# Patient Record
Sex: Female | Born: 1960 | Race: White | Hispanic: No | Marital: Single | Smoking: Never smoker
Health system: Southern US, Community
[De-identification: ages and names within clinical notes are randomized; demographics above are authoritative.]

## PROBLEM LIST (undated history)

## (undated) DIAGNOSIS — N179 Acute kidney failure, unspecified: Secondary | ICD-10-CM

## (undated) DIAGNOSIS — L0390 Cellulitis, unspecified: Secondary | ICD-10-CM

## (undated) DIAGNOSIS — I872 Venous insufficiency (chronic) (peripheral): Secondary | ICD-10-CM

## (undated) DIAGNOSIS — E039 Hypothyroidism, unspecified: Secondary | ICD-10-CM

## (undated) DIAGNOSIS — S91301A Unspecified open wound, right foot, initial encounter: Secondary | ICD-10-CM

## (undated) DIAGNOSIS — I10 Essential (primary) hypertension: Secondary | ICD-10-CM

## (undated) DIAGNOSIS — E119 Type 2 diabetes mellitus without complications: Secondary | ICD-10-CM

## (undated) DIAGNOSIS — K219 Gastro-esophageal reflux disease without esophagitis: Secondary | ICD-10-CM

---

## 2015-08-20 ENCOUNTER — Institutional Professional Consult (permissible substitution): Payer: Self-pay | Admitting: Medical

## 2019-11-04 ENCOUNTER — Other Ambulatory Visit: Payer: Self-pay

## 2019-11-04 ENCOUNTER — Encounter (HOSPITAL_COMMUNITY): Payer: Self-pay

## 2019-11-04 ENCOUNTER — Emergency Department (HOSPITAL_COMMUNITY)
Admission: EM | Admit: 2019-11-04 | Discharge: 2019-11-04 | Disposition: A | Discharge status: Home / Self Care | Payer: Medicaid Other | Attending: Emergency Medicine | Admitting: Emergency Medicine

## 2019-11-04 DIAGNOSIS — Y998 Other external cause status: Secondary | ICD-10-CM | POA: Insufficient documentation

## 2019-11-04 DIAGNOSIS — S91012A Laceration without foreign body, left ankle, initial encounter: Secondary | ICD-10-CM | POA: Insufficient documentation

## 2019-11-04 DIAGNOSIS — W228XXA Striking against or struck by other objects, initial encounter: Secondary | ICD-10-CM | POA: Insufficient documentation

## 2019-11-04 DIAGNOSIS — Y9289 Other specified places as the place of occurrence of the external cause: Secondary | ICD-10-CM | POA: Insufficient documentation

## 2019-11-04 DIAGNOSIS — Y9301 Activity, walking, marching and hiking: Secondary | ICD-10-CM | POA: Insufficient documentation

## 2019-11-04 MED ORDER — LIDOCAINE HCL (PF) 1 % IJ SOLN
30.0000 mL | Freq: Once | INTRAMUSCULAR | Status: AC
  Administered 2019-11-04: 30 mL
  Filled 2019-11-04: qty 30

## 2019-11-04 NOTE — ED Notes (Signed)
[employer] notified of need for transport of pt back to residence.  

## 2019-11-04 NOTE — Discharge Instructions (Addendum)
Thank you for allowing me to care for you today in the Emergency Department.   Keep the wound clean and dry for the next 24 hours.  Then, you can gently clean the area with warm water and soap.  Gently, pat the area dry then apply a topical antibiotic such as bacitracin or Neosporin to the wound.  After applying a topical antibiotic, place a gauze dressing over the site before placing the ankle brace.  Try to not place as much weight on the left ankle until the staples are removed.  You need to wear the ankle brace constantly until you have your staples removed in approximately 10 days.  Because of the location of the wound, moving your ankle could cause the staples to come out.  You can have the staples removed at [facility], returning to the ER, going to urgent care, or following up with primary care.  You can take 650 mg of Tylenol once every 6 hours as needed for pain control.  You do not need to be on another antibiotic.  Just continue Ancef.  You need to return to the emergency department or have the wound reevaluated if you develop fevers, chills, if the area around the wound becomes red, hot to the touch, increasingly swollen, if you develop uncontrollable bleeding from the wound, or start to have thick, mucus-like drainage from the wound.

## 2019-11-04 NOTE — ED Provider Notes (Signed)
[facility]-EMERGENCY DEPT Provider Note   CSN: [id number] Arrival date & time: 11/04/19  0023     History Chief Complaint  Patient presents with  . Laceration    Chelsea Bolton is a 59 y.o. female with a history of morbid obesity, diabetes mellitus type 2, acute renal failure, GERD, HLD, and HTN who presents to the emergency department from [facility] with a chief complaint of left ankle laceration.  The patient reports that she was ambulating with her walker when she misjudged the corner and cut her left ankle on her metal bed frame.  She did not fall.  She has diabetic neuropathy in her bilateral lower extremities and denies pain or numbness.  No pain in her left ankle, knee, or foot.  She was able to ablate with her walker after the incident.   She is currently on Ancef after being discharged from [facility] for cellulitis of her right lower extremity.  No history of MRSA infections.  Tdap is up-to-date.  She reports that her blood sugars have been well controlled recently.  No recent fever, chills.   The history is provided by the patient. No language interpreter was used.       History reviewed. No pertinent past medical history.  There are no problems to display for this patient.   History reviewed. No pertinent surgical history.   OB History   No obstetric history on file.     No family history on file.  Social History   Tobacco Use  . Smoking status: Not on file  Substance Use Topics  . Alcohol use: Not on file  . Drug use: Not on file    Home Medications Prior to Admission medications   Not on File    Allergies    Patient has no allergy information on record.  Review of Systems   Review of Systems  Constitutional: Negative for activity change, chills and fever.  Respiratory: Negative for shortness of breath.   Cardiovascular: Negative for chest pain.  Gastrointestinal: Negative for abdominal pain, diarrhea, nausea  and vomiting.  Genitourinary: Negative for dysuria.  Musculoskeletal: Negative for arthralgias, back pain, gait problem, joint swelling and myalgias.  Skin: Positive for wound. Negative for color change and rash.  Allergic/Immunologic: Negative for immunocompromised state.  Neurological: Negative for weakness, numbness and headaches.  Psychiatric/Behavioral: Negative for confusion.    Physical Exam Updated Vital Signs BP 140/69   Resp 16   Ht 5\' 6"  (1.676 m)   Wt (!) 161 kg   SpO2 99%   BMI 57.30 kg/m   Physical Exam Vitals and nursing note reviewed.  Constitutional:      General: She is not in acute distress. HENT:     Head: Normocephalic.  Eyes:     Conjunctiva/sclera: Conjunctivae normal.  Cardiovascular:     Rate and Rhythm: Normal rate and regular rhythm.     Heart sounds: No murmur heard.  No friction rub. No gallop.   Pulmonary:     Effort: Pulmonary effort is normal. No respiratory distress.  Abdominal:     General: There is no distension.     Palpations: Abdomen is soft.  Musculoskeletal:     Cervical back: Neck supple.     Comments: Full active and passive range of motion of the left ankle.  No tenderness to palpation.  Independently moves all digits of the left foot.  5-5 strength against resistance with dorsiflexion plantarflexion.  Sensation is is at baseline  given the patient's history of diabetic neuropathy.  Bilateral lower extremities are edematous.  Right lower extremity is erythematous, which appears chronic.  There is mild redness noted to the left lower extremity, but no warmth.   Skin:    General: Skin is warm.     Findings: No rash.     Comments: There is a 6 cm stellate laceration with multiple flaps noted to the medial aspect of the left lower leg near the medial malleolus.  Subcutaneous tissue was exposed.  Wound is hemostatic.  Serous drainage is noted with palpation of the skin around the wound.  Wound appears clean without foreign bodies.    Neurological:     Mental Status: She is alert.  Psychiatric:        Behavior: Behavior normal.         ED Results / Procedures / Treatments   Labs (all labs ordered are listed, but only abnormal results are displayed) Labs Reviewed - No data to display  EKG None  Radiology No results found.  Procedures .Marland KitchenLaceration Repair  Date/Time: 11/04/2019 2:29 AM Performed by: Joanne Gavel, PA-C Authorized by: Joanne Gavel, PA-C   Consent:    Consent obtained:  Verbal   Consent given by:  Patient   Risks discussed:  Infection, pain, poor cosmetic result, poor wound healing, need for additional repair, vascular damage and tendon damage Anesthesia (see MAR for exact dosages):    Anesthesia method:  Local infiltration   Local anesthetic:  Lidocaine 2% w/o epi Laceration details:    Location:  Leg   Leg location:  L lower leg (Left medial ankle)   Length (cm):  6 Repair type:    Repair type:  Complex Pre-procedure details:    Preparation:  Patient was prepped and draped in usual sterile fashion Exploration:    Limited defect created (wound extended): no     Hemostasis achieved with:  Direct pressure   Wound exploration: wound explored through full range of motion and entire depth of wound probed and visualized     Wound extent: no fascia violation noted, no foreign bodies/material noted, no muscle damage noted, no nerve damage noted, no tendon damage noted, no underlying fracture noted and no vascular damage noted     Contaminated: no   Treatment:    Area cleansed with:  Shur-Clens   Amount of cleaning:  Standard   Irrigation method:  Pressure wash   Visualized foreign bodies/material removed: no     Debridement:  Minimal Skin repair:    Repair method:  Staples   Number of staples:  17 Approximation:    Approximation:  Close Post-procedure details:    Dressing:  Antibiotic ointment, bulky dressing and splint for protection   Patient tolerance of procedure:   Tolerated well, no immediate complications   (including critical care time)  Medications Ordered in ED Medications  lidocaine (PF) (XYLOCAINE) 1 % injection 30 mL (30 mLs Infiltration Given by Other 11/04/19 0210)    ED Course  I have reviewed the triage vital signs and the nursing notes.  Pertinent labs & imaging results that were available during my care of the patient were reviewed by me and considered in my medical decision making (see chart for details).    MDM Rules/Calculators/A&P                          59 year old female with a history of morbid obesity, diabetes mellitus type 2,  acute renal failure, GERD, HLD, and HTN who presents the emergency department from [facility] with a complex stellate laceration with multiple flaps to the medial left ankle after hitting the area on a metal bed frame.  Tdap is up-to-date.  X-ray is not indicated at this time given the mechanism of injury.  Vital signs are reassuring.  Shared decision-making conversation with the patient on wound closure.  We discussed the pros and cons of sutures versus staples and patient preferred wound closure with staples.  The wound was copiously irrigated and closed with 17 staples with good approximation of the wound despite 2 separate flaps.  The patient was seen and independently evaluated by Dr. Preston Fleeting, attending physician.  She is currently on a 30-day course of Ancef from her most recent hospitalization.  No need for additional antibiotic prophylaxis.  She was placed in and Aircast ankle boot after discussing with Ortho tech.  Home wound care instructions given.  Staples need to be removed in 10 days.  She is hemodynamically stable and in no acute distress at time of discharge.  ER return precautions given.  Safe for discharge to [facility].  Final Clinical Impression(s) / ED Diagnoses Final diagnoses:  Laceration of ankle, complicated, left, initial encounter    Rx / DC Orders ED Discharge Orders    None        Barkley Boards, PA-C 11/04/19 0754    Dione Booze, MD 11/04/19 [phone]

## 2019-11-04 NOTE — ED Triage Notes (Signed)
Per EMS,  Pt is coming from [facility]. Pt slipped and fell while trying to get into bed, pt acquired apprx 1 in lac to the left ankle. Denies LOC, pain to head/neck/back. Pt has hx of cellulitis in right leg.

## 2019-11-17 ENCOUNTER — Inpatient Hospital Stay (HOSPITAL_COMMUNITY): Payer: Medicaid Other

## 2019-11-17 ENCOUNTER — Inpatient Hospital Stay (HOSPITAL_COMMUNITY)
Admission: EM | Admit: 2019-11-17 | Discharge: 2019-11-24 | DRG: 871 | Disposition: A | Discharge status: Home / Self Care | Payer: Medicaid Other | Source: Skilled Nursing Facility | Attending: Internal Medicine | Admitting: Internal Medicine

## 2019-11-17 ENCOUNTER — Emergency Department (HOSPITAL_COMMUNITY): Payer: Medicaid Other

## 2019-11-17 ENCOUNTER — Other Ambulatory Visit: Payer: Self-pay

## 2019-11-17 ENCOUNTER — Encounter (HOSPITAL_COMMUNITY): Payer: Self-pay | Admitting: Emergency Medicine

## 2019-11-17 DIAGNOSIS — K429 Umbilical hernia without obstruction or gangrene: Secondary | ICD-10-CM | POA: Diagnosis present

## 2019-11-17 DIAGNOSIS — E1151 Type 2 diabetes mellitus with diabetic peripheral angiopathy without gangrene: Secondary | ICD-10-CM | POA: Diagnosis present

## 2019-11-17 DIAGNOSIS — I872 Venous insufficiency (chronic) (peripheral): Secondary | ICD-10-CM | POA: Diagnosis present

## 2019-11-17 DIAGNOSIS — S91301A Unspecified open wound, right foot, initial encounter: Secondary | ICD-10-CM | POA: Diagnosis present

## 2019-11-17 DIAGNOSIS — R6520 Severe sepsis without septic shock: Secondary | ICD-10-CM

## 2019-11-17 DIAGNOSIS — L03116 Cellulitis of left lower limb: Secondary | ICD-10-CM | POA: Diagnosis present

## 2019-11-17 DIAGNOSIS — K1379 Other lesions of oral mucosa: Secondary | ICD-10-CM | POA: Diagnosis present

## 2019-11-17 DIAGNOSIS — N281 Cyst of kidney, acquired: Secondary | ICD-10-CM | POA: Diagnosis present

## 2019-11-17 DIAGNOSIS — I10 Essential (primary) hypertension: Secondary | ICD-10-CM | POA: Diagnosis present

## 2019-11-17 DIAGNOSIS — R451 Restlessness and agitation: Secondary | ICD-10-CM | POA: Diagnosis not present

## 2019-11-17 DIAGNOSIS — E11649 Type 2 diabetes mellitus with hypoglycemia without coma: Secondary | ICD-10-CM | POA: Diagnosis present

## 2019-11-17 DIAGNOSIS — G4733 Obstructive sleep apnea (adult) (pediatric): Secondary | ICD-10-CM | POA: Diagnosis present

## 2019-11-17 DIAGNOSIS — Z20822 Contact with and (suspected) exposure to covid-19: Secondary | ICD-10-CM | POA: Diagnosis present

## 2019-11-17 DIAGNOSIS — Z888 Allergy status to other drugs, medicaments and biological substances status: Secondary | ICD-10-CM | POA: Diagnosis not present

## 2019-11-17 DIAGNOSIS — Z794 Long term (current) use of insulin: Secondary | ICD-10-CM

## 2019-11-17 DIAGNOSIS — N179 Acute kidney failure, unspecified: Secondary | ICD-10-CM | POA: Diagnosis present

## 2019-11-17 DIAGNOSIS — E1142 Type 2 diabetes mellitus with diabetic polyneuropathy: Secondary | ICD-10-CM | POA: Diagnosis present

## 2019-11-17 DIAGNOSIS — F329 Major depressive disorder, single episode, unspecified: Secondary | ICD-10-CM | POA: Diagnosis present

## 2019-11-17 DIAGNOSIS — I890 Lymphedema, not elsewhere classified: Secondary | ICD-10-CM | POA: Diagnosis present

## 2019-11-17 DIAGNOSIS — Z87442 Personal history of urinary calculi: Secondary | ICD-10-CM

## 2019-11-17 DIAGNOSIS — K219 Gastro-esophageal reflux disease without esophagitis: Secondary | ICD-10-CM | POA: Diagnosis present

## 2019-11-17 DIAGNOSIS — E872 Acidosis: Secondary | ICD-10-CM | POA: Diagnosis present

## 2019-11-17 DIAGNOSIS — A419 Sepsis, unspecified organism: Secondary | ICD-10-CM | POA: Diagnosis present

## 2019-11-17 DIAGNOSIS — Z79899 Other long term (current) drug therapy: Secondary | ICD-10-CM | POA: Diagnosis not present

## 2019-11-17 DIAGNOSIS — E039 Hypothyroidism, unspecified: Secondary | ICD-10-CM | POA: Diagnosis present

## 2019-11-17 DIAGNOSIS — L03115 Cellulitis of right lower limb: Secondary | ICD-10-CM | POA: Diagnosis present

## 2019-11-17 DIAGNOSIS — S81809A Unspecified open wound, unspecified lower leg, initial encounter: Secondary | ICD-10-CM

## 2019-11-17 DIAGNOSIS — L97509 Non-pressure chronic ulcer of other part of unspecified foot with unspecified severity: Secondary | ICD-10-CM | POA: Diagnosis present

## 2019-11-17 DIAGNOSIS — E11621 Type 2 diabetes mellitus with foot ulcer: Secondary | ICD-10-CM | POA: Diagnosis present

## 2019-11-17 DIAGNOSIS — D509 Iron deficiency anemia, unspecified: Secondary | ICD-10-CM | POA: Diagnosis present

## 2019-11-17 DIAGNOSIS — N3000 Acute cystitis without hematuria: Secondary | ICD-10-CM | POA: Diagnosis present

## 2019-11-17 DIAGNOSIS — R6521 Severe sepsis with septic shock: Secondary | ICD-10-CM | POA: Diagnosis present

## 2019-11-17 DIAGNOSIS — B9689 Other specified bacterial agents as the cause of diseases classified elsewhere: Secondary | ICD-10-CM | POA: Diagnosis present

## 2019-11-17 DIAGNOSIS — F419 Anxiety disorder, unspecified: Secondary | ICD-10-CM | POA: Diagnosis present

## 2019-11-17 DIAGNOSIS — Z6841 Body Mass Index (BMI) 40.0 and over, adult: Secondary | ICD-10-CM

## 2019-11-17 DIAGNOSIS — E11610 Type 2 diabetes mellitus with diabetic neuropathic arthropathy: Secondary | ICD-10-CM | POA: Diagnosis present

## 2019-11-17 DIAGNOSIS — I959 Hypotension, unspecified: Secondary | ICD-10-CM

## 2019-11-17 DIAGNOSIS — A4152 Sepsis due to Pseudomonas: Secondary | ICD-10-CM | POA: Diagnosis present

## 2019-11-17 DIAGNOSIS — E119 Type 2 diabetes mellitus without complications: Secondary | ICD-10-CM | POA: Diagnosis not present

## 2019-11-17 DIAGNOSIS — Z79890 Hormone replacement therapy (postmenopausal): Secondary | ICD-10-CM

## 2019-11-17 DIAGNOSIS — L89616 Pressure-induced deep tissue damage of right heel: Secondary | ICD-10-CM | POA: Diagnosis present

## 2019-11-17 DIAGNOSIS — L0390 Cellulitis, unspecified: Secondary | ICD-10-CM

## 2019-11-17 DIAGNOSIS — R5381 Other malaise: Secondary | ICD-10-CM | POA: Diagnosis present

## 2019-11-17 HISTORY — DX: Venous insufficiency (chronic) (peripheral): I87.2

## 2019-11-17 HISTORY — DX: Type 2 diabetes mellitus without complications: E11.9

## 2019-11-17 HISTORY — DX: Hypothyroidism, unspecified: E03.9

## 2019-11-17 HISTORY — DX: Cellulitis, unspecified: L03.90

## 2019-11-17 HISTORY — DX: Unspecified open wound, right foot, initial encounter: S91.301A

## 2019-11-17 HISTORY — DX: Gastro-esophageal reflux disease without esophagitis: K21.9

## 2019-11-17 HISTORY — DX: Acute kidney failure, unspecified: N17.9

## 2019-11-17 HISTORY — DX: Essential (primary) hypertension: I10

## 2019-11-17 HISTORY — DX: Morbid (severe) obesity due to excess calories: E66.01

## 2019-11-17 LAB — CBC WITH DIFFERENTIAL/PLATELET
Abs Immature Granulocytes: 0.56 10*3/uL — ABNORMAL HIGH (ref 0.00–0.07)
Basophils Absolute: 0.1 10*3/uL (ref 0.0–0.1)
Basophils Relative: 0 %
Eosinophils Absolute: 0.2 10*3/uL (ref 0.0–0.5)
Eosinophils Relative: 1 %
HCT: 30.8 % — ABNORMAL LOW (ref 36.0–46.0)
Hemoglobin: 9.3 g/dL — ABNORMAL LOW (ref 12.0–15.0)
Immature Granulocytes: 2 %
Lymphocytes Relative: 6 %
Lymphs Abs: 1.6 10*3/uL (ref 0.7–4.0)
MCH: 27.8 pg (ref 26.0–34.0)
MCHC: 30.2 g/dL (ref 30.0–36.0)
MCV: 91.9 fL (ref 80.0–100.0)
Monocytes Absolute: 1.1 10*3/uL — ABNORMAL HIGH (ref 0.1–1.0)
Monocytes Relative: 4 %
Neutro Abs: 22.7 10*3/uL — ABNORMAL HIGH (ref 1.7–7.7)
Neutrophils Relative %: 87 %
Platelets: 523 10*3/uL — ABNORMAL HIGH (ref 150–400)
RBC: 3.35 MIL/uL — ABNORMAL LOW (ref 3.87–5.11)
RDW: 17.2 % — ABNORMAL HIGH (ref 11.5–15.5)
WBC: 26.2 10*3/uL — ABNORMAL HIGH (ref 4.0–10.5)
nRBC: 0 % (ref 0.0–0.2)

## 2019-11-17 LAB — COMPREHENSIVE METABOLIC PANEL
ALT: <5 U/L (ref 0–44)
AST: 11 U/L — ABNORMAL LOW (ref 15–41)
Albumin: 2.1 g/dL — ABNORMAL LOW (ref 3.5–5.0)
Alkaline Phosphatase: 127 U/L — ABNORMAL HIGH (ref 38–126)
Anion gap: 13 (ref 5–15)
BUN: 46 mg/dL — ABNORMAL HIGH (ref 6–20)
CO2: 16 mmol/L — ABNORMAL LOW (ref 22–32)
Calcium: 8.5 mg/dL — ABNORMAL LOW (ref 8.9–10.3)
Chloride: 109 mmol/L (ref 98–111)
Creatinine, Ser: 2.43 mg/dL — ABNORMAL HIGH (ref 0.44–1.00)
GFR calc Af Amer: 24 mL/min — ABNORMAL LOW (ref >60)
GFR calc non Af Amer: 21 mL/min — ABNORMAL LOW (ref >60)
Glucose, Bld: 151 mg/dL — ABNORMAL HIGH (ref 70–99)
Potassium: 4.1 mmol/L (ref 3.5–5.1)
Sodium: 138 mmol/L (ref 135–145)
Total Bilirubin: 0.5 mg/dL (ref 0.3–1.2)
Total Protein: 6.7 g/dL (ref 6.5–8.1)

## 2019-11-17 LAB — BLOOD GAS, VENOUS
Acid-base deficit: 14.7 mmol/L — ABNORMAL HIGH (ref 0.0–2.0)
Bicarbonate: 10.9 mmol/L — ABNORMAL LOW (ref 20.0–28.0)
O2 Saturation: 95.3 %
Patient temperature: 98.6
pCO2, Ven: 25.4 mmHg — ABNORMAL LOW (ref 44.0–60.0)
pH, Ven: 7.255 (ref 7.250–7.430)
pO2, Ven: 85.5 mmHg — ABNORMAL HIGH (ref 32.0–45.0)

## 2019-11-17 LAB — I-STAT BETA HCG BLOOD, ED (MC, WL, AP ONLY): I-stat hCG, quantitative: <5 m[IU]/mL (ref <5)

## 2019-11-17 LAB — HEMOGLOBIN A1C
Hgb A1c MFr Bld: 6.8 % — ABNORMAL HIGH (ref 4.8–5.6)
Mean Plasma Glucose: 148.46 mg/dL

## 2019-11-17 LAB — LACTIC ACID, PLASMA: Lactic Acid, Venous: 1.4 mmol/L (ref 0.5–1.9)

## 2019-11-17 LAB — GLUCOSE, CAPILLARY: Glucose-Capillary: 146 mg/dL — ABNORMAL HIGH (ref 70–99)

## 2019-11-17 LAB — PROTIME-INR
INR: 1.3 — ABNORMAL HIGH (ref 0.8–1.2)
Prothrombin Time: 15.7 seconds — ABNORMAL HIGH (ref 11.4–15.2)

## 2019-11-17 LAB — TSH: TSH: 0.781 u[IU]/mL (ref 0.350–4.500)

## 2019-11-17 LAB — APTT: aPTT: 39 seconds — ABNORMAL HIGH (ref 24–36)

## 2019-11-17 LAB — CORTISOL: Cortisol, Plasma: 23.3 ug/dL

## 2019-11-17 MED ORDER — MAGNESIUM HYDROXIDE 400 MG/5ML PO SUSP: 30.0000 mL | Freq: Every day | ORAL | Status: DC | PRN

## 2019-11-17 MED ORDER — PHENOL 1.4 % MT LIQD
1.0000 | OROMUCOSAL | Status: DC | PRN
  Administered 2019-11-18: 1 via OROMUCOSAL
  Filled 2019-11-17: qty 177

## 2019-11-17 MED ORDER — PIPERACILLIN-TAZOBACTAM 3.375 G IVPB 30 MIN: 3.3750 g | Freq: Once | INTRAVENOUS | Status: DC

## 2019-11-17 MED ORDER — SODIUM CHLORIDE 0.9% FLUSH
3.0000 mL | Freq: Two times a day (BID) | INTRAVENOUS | Status: DC
  Administered 2019-11-17 – 2019-11-24 (×7): 3 mL via INTRAVENOUS

## 2019-11-17 MED ORDER — VANCOMYCIN HCL 2000 MG/400ML IV SOLN
2000.0000 mg | Freq: Once | INTRAVENOUS | Status: AC
  Administered 2019-11-17: 2000 mg via INTRAVENOUS
  Filled 2019-11-17: qty 400

## 2019-11-17 MED ORDER — LEVOTHYROXINE SODIUM 100 MCG PO TABS
100.0000 ug | ORAL_TABLET | Freq: Every day | ORAL | Status: DC
  Administered 2019-11-18 – 2019-11-24 (×7): 100 ug via ORAL
  Filled 2019-11-17 (×7): qty 1

## 2019-11-17 MED ORDER — SORBITOL 70 % SOLN: 30.0000 mL | Freq: Every day | Status: DC | PRN

## 2019-11-17 MED ORDER — SODIUM CHLORIDE 0.9 % IV SOLN: INTRAVENOUS | Status: DC

## 2019-11-17 MED ORDER — CHLORHEXIDINE GLUCONATE CLOTH 2 % EX PADS
6.0000 | MEDICATED_PAD | Freq: Every day | CUTANEOUS | Status: DC
  Administered 2019-11-18 – 2019-11-24 (×7): 6 via TOPICAL

## 2019-11-17 MED ORDER — METRONIDAZOLE IN NACL 5-0.79 MG/ML-% IV SOLN
500.0000 mg | Freq: Once | INTRAVENOUS | Status: AC
  Administered 2019-11-17: 500 mg via INTRAVENOUS
  Filled 2019-11-17: qty 100

## 2019-11-17 MED ORDER — VANCOMYCIN HCL IN DEXTROSE 1-5 GM/200ML-% IV SOLN: 1000.0000 mg | Freq: Once | INTRAVENOUS | Status: DC

## 2019-11-17 MED ORDER — BUPROPION HCL ER (XL) 300 MG PO TB24
300.0000 mg | ORAL_TABLET | Freq: Every day | ORAL | Status: DC
  Administered 2019-11-18 – 2019-11-24 (×7): 300 mg via ORAL
  Filled 2019-11-17 (×7): qty 1

## 2019-11-17 MED ORDER — SENNA 8.6 MG PO TABS
1.0000 | ORAL_TABLET | Freq: Two times a day (BID) | ORAL | Status: DC
  Administered 2019-11-22 – 2019-11-24 (×4): 8.6 mg via ORAL
  Filled 2019-11-17 (×12): qty 1

## 2019-11-17 MED ORDER — NOREPINEPHRINE 4 MG/250ML-% IV SOLN: 0.0000 ug/min | INTRAVENOUS | Status: DC

## 2019-11-17 MED ORDER — SODIUM CHLORIDE 0.9 % IV SOLN
2.0000 g | Freq: Once | INTRAVENOUS | Status: AC
  Administered 2019-11-17: 2 g via INTRAVENOUS
  Filled 2019-11-17: qty 2

## 2019-11-17 MED ORDER — PIPERACILLIN-TAZOBACTAM 3.375 G IVPB
3.3750 g | Freq: Three times a day (TID) | INTRAVENOUS | Status: DC
  Administered 2019-11-17 – 2019-11-20 (×8): 3.375 g via INTRAVENOUS
  Filled 2019-11-17 (×8): qty 50

## 2019-11-17 MED ORDER — INSULIN ASPART 100 UNIT/ML ~~LOC~~ SOLN
0.0000 [IU] | Freq: Three times a day (TID) | SUBCUTANEOUS | Status: DC
  Administered 2019-11-18 (×2): 2 [IU] via SUBCUTANEOUS
  Administered 2019-11-18 – 2019-11-19 (×3): 1 [IU] via SUBCUTANEOUS
  Administered 2019-11-19: 2 [IU] via SUBCUTANEOUS
  Administered 2019-11-20 – 2019-11-23 (×5): 1 [IU] via SUBCUTANEOUS

## 2019-11-17 MED ORDER — SODIUM CHLORIDE 0.9 % IV BOLUS (SEPSIS)
1000.0000 mL | Freq: Once | INTRAVENOUS | Status: AC
  Administered 2019-11-17: 1000 mL via INTRAVENOUS

## 2019-11-17 MED ORDER — ONDANSETRON HCL 4 MG/2ML IJ SOLN: 4.0000 mg | Freq: Four times a day (QID) | INTRAMUSCULAR | Status: DC | PRN

## 2019-11-17 MED ORDER — SODIUM CHLORIDE 0.9 % IV BOLUS
1000.0000 mL | Freq: Once | INTRAVENOUS | Status: AC
  Administered 2019-11-17: 1000 mL via INTRAVENOUS

## 2019-11-17 MED ORDER — OXYCODONE HCL 5 MG PO TABS
5.0000 mg | ORAL_TABLET | ORAL | Status: DC | PRN
  Administered 2019-11-19 – 2019-11-23 (×6): 5 mg via ORAL
  Filled 2019-11-17 (×6): qty 1

## 2019-11-17 MED ORDER — INSULIN ASPART 100 UNIT/ML ~~LOC~~ SOLN: 0.0000 [IU] | Freq: Every day | SUBCUTANEOUS | Status: DC

## 2019-11-17 MED ORDER — HEPARIN SODIUM (PORCINE) 5000 UNIT/ML IJ SOLN
5000.0000 [IU] | Freq: Three times a day (TID) | INTRAMUSCULAR | Status: DC
  Administered 2019-11-17 – 2019-11-24 (×20): 5000 [IU] via SUBCUTANEOUS
  Filled 2019-11-17 (×18): qty 1

## 2019-11-17 MED ORDER — SODIUM CHLORIDE 0.9% FLUSH: 10.0000 mL | INTRAVENOUS | Status: DC | PRN

## 2019-11-17 MED ORDER — VANCOMYCIN HCL 2000 MG/400ML IV SOLN: 2000.0000 mg | INTRAVENOUS | Status: DC

## 2019-11-17 MED ORDER — SODIUM CHLORIDE 0.9 % IV BOLUS (SEPSIS)
800.0000 mL | Freq: Once | INTRAVENOUS | Status: AC
  Administered 2019-11-17: 800 mL via INTRAVENOUS

## 2019-11-17 MED ORDER — GABAPENTIN 300 MG PO CAPS
600.0000 mg | ORAL_CAPSULE | Freq: Three times a day (TID) | ORAL | Status: DC
  Administered 2019-11-17 – 2019-11-24 (×20): 600 mg via ORAL
  Filled 2019-11-17 (×20): qty 2

## 2019-11-17 MED ORDER — ONDANSETRON HCL 4 MG PO TABS: 4.0000 mg | ORAL_TABLET | Freq: Four times a day (QID) | ORAL | Status: DC | PRN

## 2019-11-17 MED ORDER — HYDRALAZINE HCL 20 MG/ML IJ SOLN: 10.0000 mg | Freq: Four times a day (QID) | INTRAMUSCULAR | Status: DC | PRN

## 2019-11-17 MED ORDER — IBUPROFEN 200 MG PO TABS: 400.0000 mg | ORAL_TABLET | Freq: Four times a day (QID) | ORAL | Status: DC | PRN

## 2019-11-17 MED ORDER — PANTOPRAZOLE SODIUM 40 MG PO TBEC
40.0000 mg | DELAYED_RELEASE_TABLET | Freq: Every day | ORAL | Status: DC
  Administered 2019-11-18 – 2019-11-24 (×7): 40 mg via ORAL
  Filled 2019-11-17 (×7): qty 1

## 2019-11-17 NOTE — Plan of Care (Signed)

## 2019-11-17 NOTE — Progress Notes (Signed)
Pt screaming near end of exam to stop.  3 series send to pacs. Best obtainable images. Pt aborted scan due to pain and no longer wanting to lay on scanner table.

## 2019-11-17 NOTE — Progress Notes (Signed)
Pt receiving fluid boluses based on pt's ideal body wt of 59.3kg per MD order.  1800cc fluid already given per [facility].

## 2019-11-17 NOTE — Consult Note (Signed)
NAME:  Chelsea Bolton, MRN:  [id number], DOB:  [birth year], LOS: 0 ADMISSION DATE:  11/17/2019, CONSULTATION DATE: July 5 REFERRING MD: Dr. Avis Epley, CHIEF COMPLAINT: Pain in right foot  Brief History   59 year old female with a complicated past medical history presented to the [facility] emergency department complaining of right-sided foot pain, noted to be profoundly hypotensive on November 17, 2019.  She came from a SNF and was noted to be hypotensive and dizzy when working with PT. Pulmonary and critical care medicine consulted for evaluation of likely septic shock.  History of present illness   This is a 59 year old female with a complex past medical history who was recently discharged from [facility] in the setting of septic shock from right lower leg cellulitis and a chronic wound who presented to our emergency room today from a SNF after she was noted to be dizzy and hypotensive well working with physical therapy.  She tells me that after leaving [facility] she feels that her right leg wound was improving and there had been no change in drainage.  However, over the last 2 to 3 days she developed worsening pain in that foot with walking which she said felt like "multiple needles going into the foot".  She denied any sort of associated fever or chills.  She says she has not had recent change in urinary habits such as dysuria, polyuria or foul-smelling urine.  She denies any recent nausea or vomiting.  She was brought to our emergency room by EMS when she was noted to be hypotensive.  Here she has had blood pressures in the 70s and pulmonary and critical care medicine was consulted because she was still hypotensive after receiving IV fluids.  Upon my arrival to the emergency room it seems that she has not fully received her 30 cc/kg dosing of saline yet but her blood pressure has improved in the last 4 readings show systolic blood pressures greater than 90.  Throughout her  hospital stay in our emergency room her mentation has been normal and she is making a bit of urine.  Of note when she was hospitalized at [facility] there was initially clinical concern that she may have pyelonephritis.  Urology was seen because of her history of a renal stone, no hydronephrosis was noted on imaging.  She was also noted to have a right-sided kidney cyst.  She was treated with nearly a month of beta-lactam antibiotics for the cellulitis which was noted to be slow to resolve.  Infectious diseases followed her at [facility].  Past Medical History  Cellulitis of right lower extremity, umbilical hernia Type 2 diabetes History of kidney stone on the left side  Vitamin D deficiency Hypothyroidism Morbid obesity> class III BMI 60-69.9 Obstructive sleep apnea, recent hospitalization recommended nighttime BiPAP Gastroesophageal reflux disease Open wound right foot  Significant Hospital Events   July 5 admission  Consults:  Pulmonary and critical care medicine  Procedures:  Right upper extremity dual-lumen PICC line present on admission  Significant Diagnostic Tests:    Micro Data:  July 5 blood culture July 5 urine culture  Antimicrobials:  July 5 vancomycin July 5 cefepime July 5 Flagyl  Interim history/subjective:  As above  Objective   Blood pressure 135/90, pulse 78, temperature 97.8 F (36.6 C), temperature source Oral, resp. rate 14, height 5\' 6"  (1.676 m), weight (!) 156.5 kg, SpO2 100 %.       No intake  or output data in the 24 hours ending 11/17/19 1721 Filed Weights   11/17/19 1329  Weight: (!) 156.5 kg    Examination:  General:  Morbidly obese female, resting comfortably in bed HENT: NCAT OP clear, mucus membranes very dry PULM: CTA B, normal effort CV: RRR, no mgr GI: BS+, soft, nontender, umbilical hernia noted, easily reduces, midline scar Derm: Right upper extremity picc line dressing soiled, no  surrounding edema or drainage; lymphedema both legs, redness both legs R > L, bilateral dressings feet/ankles Neuro: awake, alert, no distress, MAEW  July 5 chest x-ray images independently reviewed showing clear lungs, no congestion, no infiltrate  Resolved Hospital Problem list     Assessment & Plan:  Severe sepsis with hypotension: Now improving with IV fluid administration.  Differential diagnosis for source of sepsis includes right lower extremity cellulitis versus possibly osteomyelitis given chronic wound (organism unspecified), less likely urinary tract source has no history to suggest this.  I think it is most likely her right leg that is causing the infection because of the increase in pain recently.  Her right upper extremity PICC line dressing is dirty and she is at risk for a central line infection. Her mentation has been fine despite the initial low blood pressures which now seem to have resolved. Does not need to be admitted by critical care medicine is currently does not need vasopressors Complete 30 cc/kg crystalloid Hold home antihypertensives Appears volume depleted, would continue IV fluid resuscitation with LR after completes 30cc bolus Agree with current antibiotic regimen Would consider MRI versus CT scan of right lower extremity considering open wound (of note no osteomeylitis was seen in June 2021 at [facility].) Change out PICC line or d/c altogether  AKI due to severe sepsis Volume resuscitation Monitor BMET and UOP Replace electrolytes as needed   Elevated anion gap metabolic acidosis: Expected anion gap is only 6 so suspect the elevated anion gap is due to uremia.  However, I wonder if there is not a component of nongap metabolic acidosis, plus minus adrenal insufficiency given the clinical picture. Check serum cortisol Check urine electrolytes Continue IV fluid resuscitation as you are doing  OSA Would use BIPAP at  night  Pulmonary & critical care medicine team is happy to see this patient again should the need arise. For now will be available PRN  Best practice:    Labs   CBC: Recent Labs  Lab 11/17/19 1430  WBC 26.2*  NEUTROABS 22.7*  HGB 9.3*  HCT 30.8*  MCV 91.9  PLT 523*    Basic Metabolic Panel: Recent Labs  Lab 11/17/19 1430  NA 138  K 4.1  CL 109  CO2 16*  GLUCOSE 151*  BUN 46*  CREATININE 2.43*  CALCIUM 8.5*   GFR: Estimated Creatinine Clearance: 38.6 mL/min (A) (by C-G formula based on SCr of 2.43 mg/dL (H)). Recent Labs  Lab 11/17/19 1430 11/17/19 1515  WBC 26.2*  --   LATICACIDVEN  --  1.4    Liver Function Tests: Recent Labs  Lab 11/17/19 1430  AST 11*  ALT <5  ALKPHOS 127*  BILITOT 0.5  PROT 6.7  ALBUMIN 2.1*   No results for input(s): LIPASE, AMYLASE in the last 168 hours. No results for input(s): AMMONIA in the last 168 hours.  ABG    Component Value Date/Time   HCO3 10.9 (L) 11/17/2019 1446   ACIDBASEDEF 14.7 (H) 11/17/2019 1446   O2SAT 95.3 11/17/2019 1446  Coagulation Profile: Recent Labs  Lab 11/17/19 1430  INR 1.3*    Cardiac Enzymes: No results for input(s): CKTOTAL, CKMB, CKMBINDEX, TROPONINI in the last 168 hours.  HbA1C: No results found for: HGBA1C  CBG: No results for input(s): GLUCAP in the last 168 hours.  Review of Systems:   Gen: per HPI HEENT: Denies blurred vision, double vision, hearing loss, tinnitus, sinus congestion, rhinorrhea, sore throat, neck stiffness, dysphagia PULM: Denies shortness of breath, cough, sputum production, hemoptysis, wheezing CV: Denies chest pain, edema, orthopnea, paroxysmal nocturnal dyspnea, palpitations GI: Denies abdominal pain, nausea, vomiting, diarrhea, hematochezia, melena, constipation, change in bowel habits GU: Denies dysuria, hematuria, polyuria, oliguria, urethral discharge Endocrine: Denies hot or cold intolerance, polyuria, polyphagia or appetite change Derm:  Denies rash, dry skin, scaling or peeling skin change Heme: Denies easy bruising, bleeding, bleeding gums Neuro: Denies headache, numbness, weakness, slurred speech, loss of memory or consciousness   Past Medical History  She,  has a past medical history of Acute renal failure (ARF) (HCC), Cellulitis, Diabetes mellitus without complication (HCC), GERD (gastroesophageal reflux disease), Hypertension, Hypothyroid, Morbid obesity (HCC), Open wound of right foot, and Venous insufficiency.   Surgical History   History reviewed. No pertinent surgical history.   Social History   reports that she has never smoked. She has never used smokeless tobacco.   Family History   Her family history is not on file.   Allergies Allergies  Allergen Reactions  . Ace Inhibitors     Other reaction(s): Cough (ALLERGY/intolerance)  . Amlodipine     Other reaction(s): Other (See Comments) Excessive fluid     Home Medications  Prior to Admission medications   Medication Sig Start Date End Date Taking? Authorizing Provider  buPROPion (WELLBUTRIN XL) 300 MG 24 hr tablet Take 300 mg by mouth daily.   Yes [provider]  carvedilol (COREG) 12.5 MG tablet Take 12.5 mg by mouth 2 (two) times daily with a meal.   Yes [provider]  Cholecalciferol 1.25 MG (50000 UT) capsule Take 50,000 Units by mouth daily.   Yes [provider]  gabapentin (NEURONTIN) 300 MG capsule Take 900 mg by mouth 3 (three) times daily.   Yes [provider]  insulin glargine (LANTUS SOLOSTAR) 100 UNIT/ML Solostar Pen Inject 25 Units into the skin daily. Prime with 2units before injection   Yes [provider]  insulin lispro (HUMALOG) 100 UNIT/ML injection Inject 2-10 Units into the skin See admin instructions. Tid wc and QHS  200-250=2 units 251-300= 4 units 301-350= 6 units 351-400= 8 units 400= 10units   Yes [provider]  levothyroxine (SYNTHROID) 100 MCG tablet Take 100  mcg by mouth daily before breakfast.   Yes [provider]  loperamide (IMODIUM A-D) 2 MG tablet Take 2-4 mg by mouth 2 (two) times daily as needed for diarrhea or loose stools. Take 4mg  initially then 2mg  after each stool   Yes [provider]  losartan (COZAAR) 100 MG tablet Take 100 mg by mouth daily.   Yes [provider]  metFORMIN (GLUCOPHAGE) 1000 MG tablet Take 1,000 mg by mouth 2 (two) times daily with a meal.   Yes [provider]  omeprazole (PRILOSEC) 40 MG capsule Take 40 mg by mouth daily.   Yes [provider]  ondansetron (ZOFRAN-ODT) 4 MG disintegrating tablet Take 4 mg by mouth every 8 (eight) hours as needed for nausea or vomiting.   Yes [provider]  solifenacin (VESICARE) 10 MG  tablet Take 10 mg by mouth daily.   Yes [provider]  traMADol (ULTRAM) 50 MG tablet Take 50 mg by mouth every 6 (six) hours as needed for moderate pain.   Yes [provider]  Heparin Lock Flush (HEPARIN FLUSH) 10 UNIT/ML SOLN injection 5 mLs by Intracatheter route once. Flush picc line after ABX and saline flush    [provider]  Sodium Chloride Flush (NORMAL SALINE FLUSH) 0.9 % SOLN Inject 10 mLs into the vein in the morning and at bedtime. Before and After ABX    [provider]     Critical care time: 30 minutes     Heber [place], MD [facility] Pager: [phone] Cell: [phone] If no response, call [phone]

## 2019-11-17 NOTE — Progress Notes (Signed)
Pharmacy Antibiotic Note  Chelsea Bolton is a 59 y.o. female recently treated for RLE cellulitis/wound at [facility], presented to the ED from nursing facility on 11/17/2019 with hypotension and SOB.  Pharmacy is consulted to start vancomycin and zosyn for sepsis and LE cellulitis.  - scr 2.43 (crcl~28N) - flagyl 500 mg x1 given at 1525 and cefepime 2gm IV x1 given at 1443  Plan: - vancomycin 2000 mg IV x1 given in the ED, then 2000 mg IV q48h for goal trough 15-20 -zosyn 3.375 gm IV q8h (infuse over 4 hrs) - daily scr  ______________________________________  Height: 5\' 6"  (167.6 cm) Weight: (!) 156.5 kg (345 lb) IBW/kg (Calculated) : 59.3  Temp (24hrs), Avg:97.8 F (36.6 C), Min:97.8 F (36.6 C), Max:97.8 F (36.6 C)  Recent Labs  Lab 11/17/19 1430 11/17/19 1515  WBC 26.2*  --   CREATININE 2.43*  --   LATICACIDVEN  --  1.4    Estimated Creatinine Clearance: 38.6 mL/min (A) (by C-G formula based on SCr of 2.43 mg/dL (H)).    Allergies  Allergen Reactions  . Ace Inhibitors     Other reaction(s): Cough (ALLERGY/intolerance)  . Amlodipine     Other reaction(s): Other (See Comments) Excessive fluid     Thank you for allowing pharmacy to be a part of this patient's care.  01/18/20 11/17/2019 6:52 PM

## 2019-11-17 NOTE — H&P (Signed)
[facility] History and Physical  Chelsea Bolton BLT:[id number] DOB: 1960/07/01 DOA: 11/17/2019 PCP: Georgann Housekeeper, MD  Admitted from: SNF Chief Complaint: Hypertension, worsening right lower extremity cellulitis  History of Present Illness: Chelsea Bolton is a 59 y.o. female with PMH significant for T2DM, severe morbid obesity with BMI 56, OSA on nightly BiPAP, GERD, chronic right lower extremity cellulitis, hypothyroidism. Patient was sent [facility] ED today from rehab at [facility] with an episode of dizziness, hypotension and worsening right lower extremity cellulitis.  'Care everywhere' reviewed. Hospitalized at [facility] from 6/1-6/16 for septic shock secondary to right lower extremity colitis.  She briefly required intubation, successfully extubated.  Because of profound acidosis, she also required 2 sessions of dialysis.  Respiratory status and renal function were back to baseline at the time of discharge. In the hospitalization, right leg cellulitis wound grew Proteus mirabilis, MSSA and Aerococcus urinae.  Imaging did not show evidence of osteomyelitis or myositis.  Per ID recommendation, she was discharged on IV Ancef for 2 more weeks at discharge which she completed tentatively towards the end of June. Patient states that post discharge, cellulitis was improving.  However, over the last 2 to 3 days she developed worsening pain in that foot with walking which she said felt like "multiple needles going into the foot".   No dysuria, polyuria or foul-smelling urine.  No nausea, vomiting.  Apparently, patient was doing physical therapy when she got dizzy.  Staff found her to be hypotensive and hypoxic.  By the time of EMS arrival, patient was noted to be alert, awake, oriented x4 with oxygen saturation 99% on room air.  Blood pressure 140/100 and normal sinus rhythm.   In the ED, patient was alert, awake, but noted to be hypotensive to 70s. Patient was started  on boluses of normal saline after which her blood pressure gradually improved to low 100s. Labs with serum bicarb low at 16, BUN/creatinine elevated to 46/2.43, WBC count elevated to 26, lactic acid 1.4, hemoglobin 9.3, platelets 523. Chest x-ray normal with right sided PICC line in place. Right foot x-ray did not show evidence of osteomyelitis. Pulmonary critical care consultation was obtained. Patient did not require ICU admission. Hospital service was consulted for inpatient admission and management.     Review of Systems:  All systems were reviewed and were negative unless otherwise mentioned in the HPI   Past medical history: Past Medical History:  Diagnosis Date  . Acute renal failure (ARF) (HCC)   . Cellulitis   . Diabetes mellitus without complication (HCC)   . GERD (gastroesophageal reflux disease)   . Hypertension   . Hypothyroid   . Morbid obesity (HCC)   . Open wound of right foot   . Venous insufficiency     Past surgical history: History reviewed. No pertinent surgical history.  Social History:  reports that she has never smoked. She has never used smokeless tobacco. No history on file for alcohol use and drug use.  Allergies:  Allergies  Allergen Reactions  . Ace Inhibitors     Other reaction(s): Cough (ALLERGY/intolerance)  . Amlodipine     Other reaction(s): Other (See Comments) Excessive fluid    Family history:  History reviewed. No pertinent family history.   Home Meds: Prior to Admission medications   Medication Sig Start Date End Date Taking? Authorizing Provider  buPROPion (WELLBUTRIN XL) 300 MG 24 hr tablet Take 300 mg by mouth daily.   Yes [provider]  carvedilol (  COREG) 12.5 MG tablet Take 12.5 mg by mouth 2 (two) times daily with a meal.   Yes [provider]  Cholecalciferol 1.25 MG (50000 UT) capsule Take 50,000 Units by mouth daily.   Yes [provider]  gabapentin (NEURONTIN) 300 MG capsule Take 900  mg by mouth 3 (three) times daily.   Yes [provider]  insulin glargine (LANTUS SOLOSTAR) 100 UNIT/ML Solostar Pen Inject 25 Units into the skin daily. Prime with 2units before injection   Yes [provider]  insulin lispro (HUMALOG) 100 UNIT/ML injection Inject 2-10 Units into the skin See admin instructions. Tid wc and QHS  200-250=2 units 251-300= 4 units 301-350= 6 units 351-400= 8 units 400= 10units   Yes [provider]  levothyroxine (SYNTHROID) 100 MCG tablet Take 100 mcg by mouth daily before breakfast.   Yes [provider]  loperamide (IMODIUM A-D) 2 MG tablet Take 2-4 mg by mouth 2 (two) times daily as needed for diarrhea or loose stools. Take 4mg  initially then 2mg  after each stool   Yes [provider]  losartan (COZAAR) 100 MG tablet Take 100 mg by mouth daily.   Yes [provider]  metFORMIN (GLUCOPHAGE) 1000 MG tablet Take 1,000 mg by mouth 2 (two) times daily with a meal.   Yes [provider]  omeprazole (PRILOSEC) 40 MG capsule Take 40 mg by mouth daily.   Yes [provider]  ondansetron (ZOFRAN-ODT) 4 MG disintegrating tablet Take 4 mg by mouth every 8 (eight) hours as needed for nausea or vomiting.   Yes [provider]  solifenacin (VESICARE) 10 MG tablet Take 10 mg by mouth daily.   Yes [provider]  traMADol (ULTRAM) 50 MG tablet Take 50 mg by mouth every 6 (six) hours as needed for moderate pain.   Yes [provider]  Heparin Lock Flush (HEPARIN FLUSH) 10 UNIT/ML SOLN injection 5 mLs by Intracatheter route once. Flush picc line after ABX and saline flush    [provider]  Sodium Chloride Flush (NORMAL SALINE FLUSH) 0.9 % SOLN Inject 10 mLs into the vein in the morning and at bedtime. Before and After ABX    [provider]    Physical Exam: Vitals:   11/17/19 1641 11/17/19 1643 11/17/19 1703 11/17/19 1731  BP: (!) 104/51 (!) 109/93 135/90  93/65  Pulse:  80 78 80  Resp:  (!) 27 14 (!) 21  Temp:      TempSrc:      SpO2:  100% 100% 100%  Weight:      Height:       Wt Readings from Last 3 Encounters:  11/17/19 (!) 156.5 kg  11/04/19 (!) 161 kg   Body mass index is 55.68 kg/m.  General exam: Appears calm and comfortable.  Not in distress at the time of my evaluation Skin: No rashes, lesions or ulcers. HEENT: Atraumatic, normocephalic, supple neck, no obvious bleeding Lungs: Clear to auscultation bilaterally CVS: Regular rate and rhythm, no murmur GI/Abd soft, distended from obesity, nontender, bowel sound present CNS: Alert, awake, oriented x3  Psychiatry: Depressed look Extremities: Chronic bilateral lower extremity lymphedema with stasis changes.  Right more than left, both have bandages on.  Media attached        Consult Orders  (From admission, onward)         Start     Ordered   11/17/19 1836  Consult to wound, ostomy, continence  Defer to floor nurse.  Once       Provider:  (Not yet assigned)  Question:  Reason for Consult?  Answer:  Right lower extremity cellulitis and ulcer   11/17/19 1835   11/17/19 1835  PT eval and treat  Routine        11/17/19 1835   11/17/19 1719  Consult to hospitalist  ALL PATIENTS BEING ADMITTED/HAVING PROCEDURES NEED COVID-19 SCREENING  Once       Comments: ALL PATIENTS BEING ADMITTED/HAVING PROCEDURES NEED COVID-19 SCREENING  Provider:  (Not yet assigned)  Question Answer Comment  Place call to: [facility]   Reason for Consult Admit      11/17/19 1718          Labs on Admission:   CBC: Recent Labs  Lab 11/17/19 1430  WBC 26.2*  NEUTROABS 22.7*  HGB 9.3*  HCT 30.8*  MCV 91.9  PLT 523*    Basic Metabolic Panel: Recent Labs  Lab 11/17/19 1430  NA 138  K 4.1  CL 109  CO2 16*  GLUCOSE 151*  BUN 46*  CREATININE 2.43*  CALCIUM 8.5*    Liver Function Tests: Recent Labs  Lab 11/17/19 1430  AST 11*  ALT <5  ALKPHOS 127*  BILITOT 0.5   PROT 6.7  ALBUMIN 2.1*   No results for input(s): LIPASE, AMYLASE in the last 168 hours. No results for input(s): AMMONIA in the last 168 hours.  Cardiac Enzymes: No results for input(s): CKTOTAL, CKMB, CKMBINDEX, TROPONINI in the last 168 hours.  BNP (last 3 results) No results for input(s): BNP in the last 8760 hours.  ProBNP (last 3 results) No results for input(s): PROBNP in the last 8760 hours.  CBG: No results for input(s): GLUCAP in the last 168 hours.  Lipase  No results found for: LIPASE   Urinalysis No results found for: COLORURINE, APPEARANCEUR, LABSPEC, PHURINE, GLUCOSEU, HGBUR, BILIRUBINUR, KETONESUR, PROTEINUR, UROBILINOGEN, NITRITE, LEUKOCYTESUR   Drugs of Abuse  No results found for: LABOPIA, COCAINSCRNUR, LABBENZ, AMPHETMU, THCU, LABBARB    Radiological Exams on Admission: DG Ankle Complete Left  Result Date: 11/17/2019 CLINICAL DATA:  New soft tissue wound.  Concern for osteomyelitis. EXAM: LEFT ANKLE COMPLETE - 3+ VIEW COMPARISON:  None. FINDINGS: There is no evidence of fracture, dislocation, or joint effusion. A plantar calcaneal enthesophyte is noted. Degenerative changes are seen involving the talonavicular joint and in the dorsal midfoot. There is soft tissue swelling around the ankle. No focal osseous demineralization is identified to suggest osteomyelitis. IMPRESSION: 1. No radiographic evidence of osteomyelitis. Electronically Signed   By: Romona Curls M.D.   On: 11/17/2019 15:54   DG Chest Port 1 View  Result Date: 11/17/2019 CLINICAL DATA:  Sepsis EXAM: PORTABLE CHEST 1 VIEW COMPARISON:  10/15/2019 FINDINGS: RIGHT-sided PICC line terminates in the mid to distal superior vena cava. Cardiomediastinal contours and hilar structures are normal. Trachea is midline. Lungs are clear.  No sign of pleural effusion. Visualized skeletal structures on limited assessment without acute process. IMPRESSION: No acute cardiopulmonary disease. RIGHT-sided PICC line in  place as described. Electronically Signed   By: Donzetta Kohut M.D.   On: 11/17/2019 15:05    ------------------------------------------------------------------------------------------------ Assessment/Plan: Active Problems:   Sepsis (HCC)  Severe sepsis with hypotension secondary to right lower extremity cellulitis -Patient complains of worsening pain in the right foot. -X-ray did not show any evidence of osteomyelitis.   -We will obtain an MRI of right foot for confirmation. -Wound care consulted.  May need surgical evaluation by podiatrist/orthopedics  after the MRI scan. -Broad-spectrum antibiotics started in the ED.  -I ordered for IV vancomycin and IV Zosyn through cellulitis order set. -IV bolus given in the ED.  Continue maintenance IV hydration. -Blood pressure was initially low at 70 with normal mental status.  With IV hydration her blood pressure is improved now to low 100s.  Continue to monitor. -Follow-up blood culture report. -Continue monitor WBC and temperature trend.  AKI secondary to sepsis Metabolic acidosis -Creatinine elevated to 2.43.  Baseline creatinine less than 1. -On chart review it is noted that during his recent stay at [place], [facility].  By discharge her renal function had improved back to normal. -Expect improvement in renal function with IV hydration.  Continue to monitor. -Urine electrolytes and random cortisol level sent.    Diabetes mellitus 2 -A1c 7.5 in June Peripheral neuropathy -Home meds include Lantus 25 units daily, Metformin 1000 mg twice daily, lispro SSI -Patient reports an episode of hypoglycemia yesterday morning, likely related to worsening kidney function. -We will keep Lantus and Metformin on hold for now.  Start only sliding scale insulin with Accu-Cheks. -We will resume Neurontin at a lower dose.  Chronic bilateral lower extremity lymphedema -Patient reports tenderness  history of bilateral lower extreme lymphedema. -I do not see any diuretics on her list of home medicines.  -Once blood pressure improves, diuretics may be considered.  Chronic anemia -Baseline hemoglobin 9-10 -Obtain anemia panel.  Morbid obesity - Body mass index is 55.68 kg/m. Patient has been advised to make an attempt to improve diet and exercise patterns to aid in weight loss.  OSA -Nightly BiPAP.  Left renal cystic mass  -[facility]  -Patient needs to follow-up with urology as outpatient  Anxiety/depression Wellbutrin 300 mg daily,  Hypertension -Home meds include Coreg 12.5 mg daily, losartan 100 mg daily. -I will keep both of them on hold for now.  Continue to monitor blood pressure.  Hydralazine as needed.  Hypothyroidism -Continue Synthroid 100 mcg daily,  GERD -Continue Prilosec 40 mg daily,  Mobility: Encourage ambulation.  PT eval ordered. Code Status:   Code Status: Not on file  DVT prophylaxis:  Heparin subcu Antimicrobials:  IV Zosyn and IV vancomycin Fluid: 100/h normal saline  Diet:  Cardiac/diabetic diet  Consultants: Critical care consult appreciated Family Communication:  None at bedside  [facility]: [facility]              Anticipated d/c is to: SNF              Anticipated d/c date is: 3 days  ------------------------------------------------------------------------------------- Severity of Illness: The appropriate patient status for this patient is INPATIENT. Inpatient status is judged to be reasonable and necessary in order to provide the required intensity of service to ensure the patient's safety. The patient's presenting symptoms, physical exam findings, and initial radiographic and laboratory data in the context of their chronic comorbidities is felt to place them at high risk for further clinical deterioration. Furthermore, it is not anticipated that the patient will be medically stable for  discharge from the hospital within 2 midnights of admission. The following factors support the patient status of inpatient.   " The patient's presenting symptoms include right lower extremity wound, hypertension. " The worrisome physical exam findings include right extremity cellulitis, sepsis " The initial radiographic and laboratory data are worrisome because of sepsis. " The chronic co-morbidities  include chronic lower extremity lymphedema, diabetes.   * I certify that at the point of admission it is my clinical judgment that the patient will require inpatient hospital care spanning beyond 2 midnights from the point of admission due to high intensity of service, high risk for further deterioration and high frequency of surveillance required.*  -------------------------------------------------------------------------------------   Mack Hook, MD [facility] Pager: [phone] (Secure Chat preferred). 11/17/2019

## 2019-11-17 NOTE — Progress Notes (Signed)
A consult was received from an ED physician for cefepime and vancomycin per pharmacy dosing.  The patient's profile has been reviewed for ht/wt/allergies/indication/available labs.   A one time order has been placed for cefepime 2 g iv per MD and vancomycin 2000 mg iv once along with metronidazole 500 mg iv once per MD.  Further antibiotics/pharmacy consults should be ordered by admitting physician if indicated.                       Thank you, Valentina Gu 11/17/2019  2:11 PM

## 2019-11-17 NOTE — ED Triage Notes (Signed)
Pt BIB ems and coming from [facility].  Pt was doing physical therapy when pt got dizzy.  Staff found pt to be hypotensive and low O2 sats.  On EMS arrival pt was noted to be a/o x 4 with O2 sats at 99% RA. EMS's BP was 140/100 and NSR.

## 2019-11-17 NOTE — ED Provider Notes (Signed)
[facility]-EMERGENCY DEPT Provider Note   CSN: [id number] Arrival date & time: 11/17/19  1312     History Chief Complaint  Patient presents with  . Hypotension  . Foot Pain    Chelsea Bolton is a 59 y.o. female.  Chelsea Bolton is a 59 y.o. female with hx of DM, GERD, HTN, hypothyroid, morbid obesity, cellulitis, ARF, venous insufficiency, who presents via EMS from [facility] via EMS for hypotension and foot pain.  Patient was doing rehab at the facility when she started to complain of dizziness, vitals were checked and she was found to be hypertensive and facility also reported hypoxia.  EMS was called, when they arrived they reported normal BP of 140/100 and no noted hypoxia, but upon arrival to the department patient has systolic BP in the 70s.  Despite this she is alert and mentating well, able to answer questions and provide history.  She states that she no longer feels dizzy and feels in general back to normal.  She states that she was admitted at [facility] at the beginning of June for 15 days, and was treated for cellulitis of her right lower extremity, which she reports she has been dealing with for several years now.  Chart review shows that the patient was admitted with septic shock, required intubation and pressors and was found to have a cellulitis as well as a kidney infection, she had acute renal failure and did require 2 rounds of dialysis, but upon discharge renal function had returned to normal.  Patient states that she completed 2 additional weeks of IV antibiotics in the facility and completed this last week.  States that on Saturday she started to notice some sharp stabbing pains in the right heel and foot which were new.  She states she noticed some superficial wounds on the foot but did not note any on her heel.  States that the redness on her leg has worsened some.  Patient also noted to have a dressing over her left ankle, she states that  she was seen in the emergency department on 6/22 after she tripped and cut her ankle, she had staples placed and these were removed about a week and half ago.  Patient reports that she thought the area is feeling well but has been keeping a dressing over it.  She denies chest pain, shortness of breath, cough, fevers or chills.  Denies abdominal pain, vomiting, diarrhea or any urinary symptoms or flank pain.  Patient adamantly states that she does not want to be admitted into the hospital again.        Past Medical History:  Diagnosis Date  . Acute renal failure (ARF) (HCC)   . Cellulitis   . Diabetes mellitus without complication (HCC)   . GERD (gastroesophageal reflux disease)   . Hypertension   . Hypothyroid   . Morbid obesity (HCC)   . Open wound of right foot   . Venous insufficiency     There are no problems to display for this patient.   History reviewed. No pertinent surgical history.   OB History   No obstetric history on file.     History reviewed. No pertinent family history.  Social History   Tobacco Use  . Smoking status: Never Smoker  . Smokeless tobacco: Never Used  Substance Use Topics  . Alcohol use: Not on file  . Drug use: Not on file    Home Medications Prior to Admission medications   Medication  Sig Start Date End Date Taking? Authorizing Provider  buPROPion (WELLBUTRIN XL) 300 MG 24 hr tablet Take 300 mg by mouth daily.   Yes [provider]  carvedilol (COREG) 12.5 MG tablet Take 12.5 mg by mouth 2 (two) times daily with a meal.   Yes [provider]  Cholecalciferol 1.25 MG (50000 UT) capsule Take 50,000 Units by mouth daily.   Yes [provider]  gabapentin (NEURONTIN) 300 MG capsule Take 900 mg by mouth 3 (three) times daily.   Yes [provider]  insulin glargine (LANTUS SOLOSTAR) 100 UNIT/ML Solostar Pen Inject 25 Units into the skin daily. Prime with 2units before injection   Yes [provider]  insulin lispro (HUMALOG) 100 UNIT/ML injection Inject 2-10 Units into the skin See admin instructions. Tid wc and QHS  200-250=2 units 251-300= 4 units 301-350= 6 units 351-400= 8 units 400= 10units   Yes [provider]  levothyroxine (SYNTHROID) 100 MCG tablet Take 100 mcg by mouth daily before breakfast.   Yes [provider]  loperamide (IMODIUM A-D) 2 MG tablet Take 2-4 mg by mouth 2 (two) times daily as needed for diarrhea or loose stools. Take 4mg  initially then 2mg  after each stool   Yes [provider]  losartan (COZAAR) 100 MG tablet Take 100 mg by mouth daily.   Yes [provider]  metFORMIN (GLUCOPHAGE) 1000 MG tablet Take 1,000 mg by mouth 2 (two) times daily with a meal.   Yes [provider]  omeprazole (PRILOSEC) 40 MG capsule Take 40 mg by mouth daily.   Yes [provider]  ondansetron (ZOFRAN-ODT) 4 MG disintegrating tablet Take 4 mg by mouth every 8 (eight) hours as needed for nausea or vomiting.   Yes [provider]  solifenacin (VESICARE) 10 MG tablet Take 10 mg by mouth daily.   Yes [provider]  traMADol (ULTRAM) 50 MG tablet Take 50 mg by mouth every 6 (six) hours as needed for moderate pain.   Yes [provider]  Heparin Lock Flush (HEPARIN FLUSH) 10 UNIT/ML SOLN injection 5 mLs by Intracatheter route once. Flush picc line after ABX and saline flush    [provider]  Sodium Chloride Flush (NORMAL SALINE FLUSH) 0.9 % SOLN Inject 10 mLs into the vein in the morning and at bedtime. Before and After ABX    [provider]    Allergies    Ace inhibitors and Amlodipine  Review of Systems   Review of Systems  Constitutional: Negative for chills and fever.  HENT: Negative.   Respiratory: Negative for cough and shortness of breath.   Cardiovascular: Positive for leg swelling. Negative for chest pain.  Gastrointestinal: Negative for abdominal pain, nausea and  vomiting.  Genitourinary: Negative for dysuria and frequency.  Musculoskeletal:       R foot pain  Skin: Positive for color change and wound. Negative for rash.  Neurological: Positive for dizziness. Negative for syncope, weakness and light-headedness.  All other systems reviewed and are negative.   Physical Exam Updated Vital Signs BP (!) 77/40   Pulse 81   Temp 97.8 F (36.6 C) (Oral)   Resp 16   Ht 5\' 6"  (1.676 m)   Wt (!) 156.5 kg   SpO2 100%   BMI 55.68 kg/m   Physical Exam Vitals and nursing note reviewed.  Constitutional:      General: She is not in acute distress.    Appearance: She is well-developed. She  is not diaphoretic.  HENT:     Head: Normocephalic and atraumatic.  Eyes:     General:        Right eye: No discharge.        Left eye: No discharge.     Pupils: Pupils are equal, round, and reactive to light.  Cardiovascular:     Rate and Rhythm: Normal rate and regular rhythm.     Pulses: Normal pulses.     Heart sounds: Normal heart sounds. No murmur heard.  No friction rub. No gallop.   Pulmonary:     Effort: Pulmonary effort is normal. No respiratory distress.     Breath sounds: Normal breath sounds. No wheezing or rales.     Comments: Respirations equal and unlabored, patient able to speak in full sentences, lungs clear to auscultation bilaterally Abdominal:     General: Bowel sounds are normal. There is no distension.     Palpations: Abdomen is soft. There is no mass.     Tenderness: There is no abdominal tenderness. There is no guarding.     Comments: Abdomen soft, nondistended, nontender to palpation in all quadrants without guarding or peritoneal signs  Musculoskeletal:        General: No deformity.     Cervical back: Neck supple.     Comments: Right lower leg erythematous and cellulitic, chronic appearing wounds to the shin as well as multiple superficial wounds to the foot, there are 2 wounds on the heel which patient was not aware of, no  obvious purulent drainage from the wound. Necrosis where previously stapled wound has not completely healed (see photo below)  Skin:    General: Skin is warm and dry.     Capillary Refill: Capillary refill takes less than 2 seconds.  Neurological:     Mental Status: She is alert.     Coordination: Coordination normal.     Comments: Speech is clear, able to follow commands CN III-XII intact Normal strength in upper and lower extremities bilaterally including dorsiflexion and plantar flexion, strong and equal grip strength Sensation normal to light and sharp touch Moves extremities without ataxia, coordination intact   Psychiatric:        Mood and Affect: Mood normal.        Behavior: Behavior normal.           ED Results / Procedures / Treatments   Labs (all labs ordered are listed, but only abnormal results are displayed) Labs Reviewed  COMPREHENSIVE METABOLIC PANEL - Abnormal; Notable for the following components:      Result Value   CO2 16 (*)    Glucose, Bld 151 (*)    BUN 46 (*)    Creatinine, Ser 2.43 (*)    Calcium 8.5 (*)    Albumin 2.1 (*)    AST 11 (*)    Alkaline Phosphatase 127 (*)    GFR calc non Af Amer 21 (*)    GFR calc Af Amer 24 (*)    All other components within normal limits  CBC WITH DIFFERENTIAL/PLATELET - Abnormal; Notable for the following components:   WBC 26.2 (*)    RBC 3.35 (*)    Hemoglobin 9.3 (*)    HCT 30.8 (*)    RDW 17.2 (*)    Platelets 523 (*)    Neutro Abs 22.7 (*)    Monocytes Absolute 1.1 (*)    Abs Immature Granulocytes 0.56 (*)    All other components within normal limits  APTT - Abnormal; Notable for the following components:   aPTT 39 (*)    All other components within normal limits  PROTIME-INR - Abnormal; Notable for the following components:   Prothrombin Time 15.7 (*)    INR 1.3 (*)    All other components within normal limits  BLOOD GAS, VENOUS - Abnormal; Notable for the following components:   pCO2, Ven  25.4 (*)    pO2, Ven 85.5 (*)    Bicarbonate 10.9 (*)    Acid-base deficit 14.7 (*)    All other components within normal limits  CULTURE, BLOOD (ROUTINE X 2)  CULTURE, BLOOD (ROUTINE X 2)  URINE CULTURE  TSH  LACTIC ACID, PLASMA  LACTIC ACID, PLASMA  URINALYSIS, ROUTINE W REFLEX MICROSCOPIC  I-STAT BETA HCG BLOOD, ED ([facility], [facility], [facility] ONLY)    EKG EKG Interpretation  Date/Time:  Monday November 17 2019 14:10:38 EDT Ventricular Rate:  79 PR Interval:    QRS Duration: 92 QT Interval:  367 QTC Calculation: 421 R Axis:   2 Text Interpretation: Sinus rhythm Probable left atrial enlargement Abnormal R-wave progression, early transition Confirmed by Tilden Fossaees, Elizabeth [phone]([id number]) on 11/17/2019 2:54:48 PM   Radiology DG Chest Port 1 View  Result Date: 11/17/2019 CLINICAL DATA:  Sepsis EXAM: PORTABLE CHEST 1 VIEW COMPARISON:  10/15/2019 FINDINGS: RIGHT-sided PICC line terminates in the mid to distal superior vena cava. Cardiomediastinal contours and hilar structures are normal. Trachea is midline. Lungs are clear.  No sign of pleural effusion. Visualized skeletal structures on limited assessment without acute process. IMPRESSION: No acute cardiopulmonary disease. RIGHT-sided PICC line in place as described. Electronically Signed   By: Donzetta KohutGeoffrey  Wile M.D.   On: 11/17/2019 15:05    Procedures .Critical Care Performed by: Dartha LodgeFord, Gabrianna Fassnacht N, PA-C Authorized by: Dartha LodgeFord, Jaylon Boylen N, PA-C   Critical care provider statement:    Critical care time (minutes):  45   Critical care was necessary to treat or prevent imminent or life-threatening deterioration of the following conditions:  Sepsis and shock   Critical care was time spent personally by me on the following activities:  Discussions with consultants, evaluation of patient's response to treatment, examination of patient, ordering and performing treatments and interventions, ordering and review of laboratory studies, ordering and review of radiographic studies,  pulse oximetry, re-evaluation of patient's condition, obtaining history from patient or surrogate and review of old charts   (including critical care time)  Medications Ordered in ED Medications  sodium chloride 0.9 % bolus 1,000 mL (1,000 mLs Intravenous New Bag/Given 11/17/19 1440)    And  sodium chloride 0.9 % bolus 800 mL (has no administration in time range)  metroNIDAZOLE (FLAGYL) IVPB 500 mg (500 mg Intravenous New Bag/Given 11/17/19 1525)  vancomycin (VANCOREADY) IVPB 2000 mg/400 mL (has no administration in time range)  ceFEPIme (MAXIPIME) 2 g in sodium chloride 0.9 % 100 mL IVPB (0 g Intravenous Stopped 11/17/19 1523)    ED Course  I have reviewed the triage vital signs and the nursing notes.  Pertinent labs & imaging results that were available during my care of the patient were reviewed by me and considered in my medical decision making (see chart for details).    MDM Rules/Calculators/A&P                           59 year old female arrives via EMS from SNF due to hypotension and foot pain.  On arrival patient with systolic blood pressure in  the 47s, despite this fortunately patient is alert and mentating well.  She has multiple wounds to the lower extremities with significant cellulitis, subcu.  This is likely chronic but given her hypertension high concern for potential infection.  Blood pressure checked manually multiple times consistent with systolic BP in the 70s.  Code sepsis initiated and patient started on broad-spectrum antibiotics given recent hospital admission with septic shock requiring pressors and then the patient, cultures grew out multiple species.  Was evaluated for osteomyelitis at this time without evidence.  She also has a new wound to the left ankle with surrounding erythema from recent laceration.  30 cc/kg fluid bolus given as well.  We will continue to monitor patient's blood pressure closely.  Labs significant for leukocytosis of 26.2, hemoglobin of 9.3,  acute kidney injury with creatinine of 2.43, was 0.8 at discharge from hospital.  CO2 16, no other significant electrolyte derangements.  Lactic acid is fortunately not elevated.  Chest x-ray is clear.  Urinalysis is pending.  X-ray of the left ankle with no evidence of osteomyelitis, recently had evaluated without evidence of osteo-.  High suspicion for cellulitis as source of infection.  Despite fluids and antibiotics patient maintains BP in the 70s without much improvement, has received > 1L IV fluids at this time Case discussed with Dr. Madilyn Hook who is seen and evaluated the patient as well.  Will start Levophed if pressure is not improving and consult critical care.  Case discussed with Dr. Kendrick Fries with critical care who has seen and evaluated patient, but now last for blood pressure readings have consistently been above 100, he will write consult note but recommends medicine admission  Case discussed with Dr. Pola Corn with [facility] hospitalist who will see and admit the patient. Final Clinical Impression(s) / ED Diagnoses Final diagnoses:  Hypotension, unspecified hypotension type  Septic shock (HCC)  Cellulitis of both lower extremities  Multiple opens wound of lower extremity, unspecified laterality, initial encounter    Rx / DC Orders ED Discharge Orders    None       Legrand Rams 11/19/19 0212    Tilden Fossa, MD 11/22/19 [phone]

## 2019-11-18 LAB — IRON AND TIBC
Iron: 16 ug/dL — ABNORMAL LOW (ref 28–170)
Saturation Ratios: 14 % (ref 10.4–31.8)
TIBC: 118 ug/dL — ABNORMAL LOW (ref 250–450)
UIBC: 102 ug/dL

## 2019-11-18 LAB — CBC WITH DIFFERENTIAL/PLATELET
Abs Immature Granulocytes: 0.7 10*3/uL — ABNORMAL HIGH (ref 0.00–0.07)
Basophils Absolute: 0.2 10*3/uL — ABNORMAL HIGH (ref 0.0–0.1)
Basophils Relative: 1 %
Eosinophils Absolute: 0.2 10*3/uL (ref 0.0–0.5)
Eosinophils Relative: 1 %
HCT: 28.7 % — ABNORMAL LOW (ref 36.0–46.0)
Hemoglobin: 8.4 g/dL — ABNORMAL LOW (ref 12.0–15.0)
Immature Granulocytes: 3 %
Lymphocytes Relative: 7 %
Lymphs Abs: 1.9 10*3/uL (ref 0.7–4.0)
MCH: 27.5 pg (ref 26.0–34.0)
MCHC: 29.3 g/dL — ABNORMAL LOW (ref 30.0–36.0)
MCV: 93.8 fL (ref 80.0–100.0)
Monocytes Absolute: 1.4 10*3/uL — ABNORMAL HIGH (ref 0.1–1.0)
Monocytes Relative: 5 %
Neutro Abs: 21.1 10*3/uL — ABNORMAL HIGH (ref 1.7–7.7)
Neutrophils Relative %: 83 %
Platelets: 437 10*3/uL — ABNORMAL HIGH (ref 150–400)
RBC: 3.06 MIL/uL — ABNORMAL LOW (ref 3.87–5.11)
RDW: 17.8 % — ABNORMAL HIGH (ref 11.5–15.5)
WBC: 25.4 10*3/uL — ABNORMAL HIGH (ref 4.0–10.5)
nRBC: 0 % (ref 0.0–0.2)

## 2019-11-18 LAB — BASIC METABOLIC PANEL
Anion gap: 10 (ref 5–15)
BUN: 48 mg/dL — ABNORMAL HIGH (ref 6–20)
CO2: 12 mmol/L — ABNORMAL LOW (ref 22–32)
Calcium: 7.6 mg/dL — ABNORMAL LOW (ref 8.9–10.3)
Chloride: 114 mmol/L — ABNORMAL HIGH (ref 98–111)
Creatinine, Ser: 2.25 mg/dL — ABNORMAL HIGH (ref 0.44–1.00)
GFR calc Af Amer: 27 mL/min — ABNORMAL LOW (ref >60)
GFR calc non Af Amer: 23 mL/min — ABNORMAL LOW (ref >60)
Glucose, Bld: 167 mg/dL — ABNORMAL HIGH (ref 70–99)
Potassium: 3.7 mmol/L (ref 3.5–5.1)
Sodium: 136 mmol/L (ref 135–145)

## 2019-11-18 LAB — URINALYSIS, ROUTINE W REFLEX MICROSCOPIC
Bacteria, UA: NONE SEEN
Bilirubin Urine: NEGATIVE
Glucose, UA: NEGATIVE mg/dL
Ketones, ur: NEGATIVE mg/dL
Nitrite: NEGATIVE
Protein, ur: 100 mg/dL — AB
Specific Gravity, Urine: 1.013 (ref 1.005–1.030)
WBC, UA: >50 WBC/hpf — ABNORMAL HIGH (ref 0–5)
pH: 6 (ref 5.0–8.0)

## 2019-11-18 LAB — PHOSPHORUS: Phosphorus: 5 mg/dL — ABNORMAL HIGH (ref 2.5–4.6)

## 2019-11-18 LAB — CREATININE, URINE, RANDOM: Creatinine, Urine: 78.04 mg/dL

## 2019-11-18 LAB — FERRITIN: Ferritin: 255 ng/mL (ref 11–307)

## 2019-11-18 LAB — GLUCOSE, CAPILLARY
Glucose-Capillary: 135 mg/dL — ABNORMAL HIGH (ref 70–99)
Glucose-Capillary: 152 mg/dL — ABNORMAL HIGH (ref 70–99)
Glucose-Capillary: 158 mg/dL — ABNORMAL HIGH (ref 70–99)
Glucose-Capillary: 132 mg/dL — ABNORMAL HIGH (ref 70–99)

## 2019-11-18 LAB — TRANSFERRIN: Transferrin: 84 mg/dL — ABNORMAL LOW (ref 192–382)

## 2019-11-18 LAB — CHLORIDE, URINE, RANDOM: Chloride Urine: 52 mmol/L

## 2019-11-18 LAB — NA AND K (SODIUM & POTASSIUM), RAND UR
Potassium Urine: 26 mmol/L
Sodium, Ur: 67 mmol/L

## 2019-11-18 LAB — FOLATE: Folate: 8.8 ng/mL (ref >5.9)

## 2019-11-18 LAB — MRSA PCR SCREENING: MRSA by PCR: NEGATIVE

## 2019-11-18 LAB — HIV ANTIBODY (ROUTINE TESTING W REFLEX): HIV Screen 4th Generation wRfx: NONREACTIVE

## 2019-11-18 LAB — MAGNESIUM: Magnesium: 1.4 mg/dL — ABNORMAL LOW (ref 1.7–2.4)

## 2019-11-18 LAB — VITAMIN B12: Vitamin B-12: 638 pg/mL (ref 180–914)

## 2019-11-18 MED ORDER — MAGNESIUM OXIDE 400 (241.3 MG) MG PO TABS
400.0000 mg | ORAL_TABLET | Freq: Two times a day (BID) | ORAL | Status: AC
  Administered 2019-11-18 (×2): 400 mg via ORAL
  Filled 2019-11-18 (×2): qty 1

## 2019-11-18 MED ORDER — K PHOS MONO-SOD PHOS DI & MONO 155-852-130 MG PO TABS
500.0000 mg | ORAL_TABLET | Freq: Two times a day (BID) | ORAL | Status: AC
  Administered 2019-11-18 (×2): 500 mg via ORAL
  Filled 2019-11-18 (×2): qty 2

## 2019-11-18 MED ORDER — CALCIUM CARBONATE-VITAMIN D 500-200 MG-UNIT PO TABS
1.0000 | ORAL_TABLET | Freq: Two times a day (BID) | ORAL | Status: AC
  Administered 2019-11-18 – 2019-11-19 (×4): 1 via ORAL
  Filled 2019-11-18 (×4): qty 1

## 2019-11-18 MED ORDER — MAGNESIUM SULFATE 2 GM/50ML IV SOLN
2.0000 g | Freq: Once | INTRAVENOUS | Status: AC
  Administered 2019-11-18: 2 g via INTRAVENOUS
  Filled 2019-11-18: qty 50

## 2019-11-18 MED ORDER — COLLAGENASE 250 UNIT/GM EX OINT
TOPICAL_OINTMENT | Freq: Every day | CUTANEOUS | Status: DC
  Filled 2019-11-18: qty 30

## 2019-11-18 MED ORDER — MAGIC MOUTHWASH W/LIDOCAINE
5.0000 mL | Freq: Three times a day (TID) | ORAL | Status: DC | PRN
  Administered 2019-11-18 – 2019-11-19 (×5): 5 mL via ORAL
  Filled 2019-11-18 (×6): qty 5

## 2019-11-18 MED ORDER — LIP MEDEX EX OINT
1.0000 "application " | TOPICAL_OINTMENT | CUTANEOUS | Status: DC | PRN
  Filled 2019-11-18: qty 7

## 2019-11-18 MED ORDER — ZINC OXIDE 40 % EX OINT
TOPICAL_OINTMENT | Freq: Three times a day (TID) | CUTANEOUS | Status: DC
  Filled 2019-11-18: qty 57

## 2019-11-18 NOTE — Progress Notes (Signed)
PROGRESS NOTE  Chelsea Bolton  DOB: [birth year]  PCP: Georgann Housekeeper, MD YIR:[id number]  DOA: 11/17/2019  LOS: 1 day   Chief Complaint  Patient presents with  . Hypotension  . Foot Pain   Brief narrative: Chelsea Bolton is a 59 y.o. female with PMH significant for T2DM, severe morbid obesity with BMI 56, OSA on nightly BiPAP, GERD, chronic right lower extremity cellulitis, hypothyroidism. Patient was sent [facility] ED today from rehab at [facility] with an episode of dizziness, hypotension and worsening right lower extremity cellulitis.  'Care everywhere' reviewed. Hospitalized at [facility] from 6/1-6/16 for septic shock secondary to right lower extremity colitis.  She briefly required intubation, successfully extubated.  Because of profound acidosis, she also required 2 sessions of dialysis.  Respiratory status and renal function were back to baseline at the time of discharge. In the hospitalization, right leg cellulitis wound grew Proteus mirabilis, MSSA and Aerococcus urinae.  Imaging did not show evidence of osteomyelitis or myositis.  Per ID recommendation, she was discharged on IV Ancef for 2 more weeks at discharge which she completed tentatively towards the end of June. Patient states that at the SNF, cellulitis was improving.  However, over the last 2 to 3 days she developed worsening pain in that foot with walking which she said felt like "multiple needles going into the foot".   No dysuria, polyuria or foul-smelling urine.  No nausea, vomiting.  On the day of admission, patient was doing physical therapy when she got dizzy.  Staff found her to be hypotensive and hypoxic.  By the time of EMS arrival, patient was noted to be alert, awake, oriented x4 with oxygen saturation 99% on room air.  Blood pressure 140/100 and normal sinus rhythm.   In the ED, patient was alert, awake, but noted to be hypotensive to 70s. Patient was started on boluses of normal saline  after which her blood pressure gradually improved to low 100s. Labs with serum bicarb low at 16, BUN/creatinine elevated to 46/2.43, WBC count elevated to 26, lactic acid 1.4, hemoglobin 9.3, platelets 523. Chest x-ray normal with right sided PICC line in place. Right foot x-ray did not show evidence of osteomyelitis. Pulmonary critical care consultation was obtained.  With improvement in blood pressure with fluid, patient did not require ICU admission. Hospitalist service was consulted for inpatient admission and management.  Subjective: Patient was seen and examined this morning.  Pleasant middle-aged Caucasian female. Looks older for age.  Lying down in bed.  Not in distress.  Feels better.  Pain controlled. Seen by wound care nurse today.   Chart reviewed. No fever, heart rate in 80s, blood pressure is mostly low in 90s WBC count still high at 25,000, hemoglobin low at 8.4. Creatinine improving, 2.25 today.  Magnesium low at 1.4, iron profile resulted with iron level at 16, ferritin level pending. Folate and vitamin B12 level normal.  Assessment/Plan: Severe sepsis with hypotension  Acute on chronic right lower extremity cellulitis -Patient presented with worsening pain in the right foot. -X-ray did not show any evidence of osteomyelitis. -MRI of right foot confirms soft tissue findings of cellulitis.  It did not show findings suggestive of osteomyelitis or abscess. -Wound care consult appreciated.  Local wound care recommended.  No need of surgical debridement at this time.  Patient needs to follow-up with orthopedics and wound care service as an outpatient. -Currently on IV vancomycin and IV Zosyn.  No fever but WBC count remains high.  Continue to monitor. -Follow-up blood culture report.  Hypotension at presentation History of hypertension -Patient was initially hypotensive down to 70s.  Blood pressure is now improved on maintenance hydration. Continue normal saline today at 100  mL/h.  Cortisol level sent in admission is normal. -Home meds include Coreg 12.5 mg daily, losartan 100 mg daily. -Currently both on hold because of hypotension.  AKI secondary to sepsis Metabolic acidosis -Creatinine elevated to 2.43.  Baseline creatinine less than 1. -With IV hydration, creatinine is improving, 2.5 today. -On chart review it is noted that during his recent stay at [facility], she had profound acidosis and required 2 sessions of dialysis.  By discharge her renal function had improved back to normal.  Diabetes mellitus 2 -A1c 7.5 in June Peripheral neuropathy -Home meds include Lantus 25 units daily, Metformin 1000 mg twice daily, lispro SSI -Currently patient's blood sugar is trending less than 150 only on sliding scale insulin. -Likely because of impaired kidney function. -Continue to monitor Accu-Cheks and reinitiate Lantus when appropriate. -Continue Neurontin at a lower dose.  Chronic bilateral lower extremity lymphedema -Patient reports tenderness history of bilateral lower extreme lymphedema last 10 years however she is not on any diuretics at home. -Once blood pressure improves, diuretics may be considered.  Chronic anemia -Baseline hemoglobin 9-10 -No active bleeding but hemoglobin down to 8.4 today.  Continue to monitor. -iron profile resulted with iron level at 16, ferritin level pending. -Folate and vitamin B12 level normal.  Morbid obesity - Body mass index is 55.68 kg/m. Patient has been advised to make an attempt to improve diet and exercise patterns to aid in weight loss.  OSA -Nightly BiPAP.  Left renal cystic mass  -identified while in [facility].  -Patient needs to follow-up with urology as outpatient  Anxiety/depression Wellbutrin 300 mg daily  Hypothyroidism -Continue Synthroid 100 mcg daily,  GERD -Continue Prilosec 40 mg daily  Pressure Injury 11/18/19 Heel Right Deep Tissue Pressure Injury - Purple or maroon localized area  of discolored intact skin or blood-filled blister due to damage of underlying soft tissue from pressure and/or shear. (Active)  11/18/19   Location: Heel  Location Orientation: Right  Staging: Deep Tissue Pressure Injury - Purple or maroon localized area of discolored intact skin or blood-filled blister due to damage of underlying soft tissue from pressure and/or shear.  Wound Description (Comments):   Present on Admission: Yes   Mobility: PT eval obtained.  SNF Code Status:   Code Status: Full Code  Nutritional status: Body mass index is 55.68 kg/m.     Diet Order            Diet heart healthy/carb modified Room service appropriate? Yes; Fluid consistency: Thin  Diet effective now                 DVT prophylaxis: heparin injection 5,000 Units Start: 11/17/19 2200   Antimicrobials:  IV Zosyn and IV vancomycin Fluid: Normal saline at 100 mL/h to continue  Consultants: Wound care Family Communication:  Not at bedside  Status is: Inpatient  Remains inpatient appropriate because:Persistent severe electrolyte disturbances, Ongoing diagnostic testing needed not appropriate for outpatient work up and IV treatments appropriate due to intensity of illness or inability to take PO   Dispo: The patient is from: SNF              Anticipated d/c is to: SNF              Anticipated d/c date  is: 3 days              Patient currently is not medically stable to d/c.       Infusions:  . sodium chloride 100 mL/hr at 11/18/19 0439  . piperacillin-tazobactam (ZOSYN)  IV 3.375 g (11/18/19 0440)  . [START ON 11/19/2019] vancomycin      Scheduled Meds: . buPROPion  300 mg Oral Daily  . calcium-vitamin D  1 tablet Oral BID  . Chlorhexidine Gluconate Cloth  6 each Topical Daily  . collagenase   Topical Daily  . gabapentin  600 mg Oral TID  . heparin  5,000 Units Subcutaneous Q8H  . insulin aspart  0-5 Units Subcutaneous QHS  . insulin aspart  0-9 Units Subcutaneous TID WC  .  levothyroxine  100 mcg Oral QAC breakfast  . liver oil-zinc oxide   Topical TID  . magnesium oxide  400 mg Oral BID  . pantoprazole  40 mg Oral Daily  . phosphorus  500 mg Oral BID  . senna  1 tablet Oral BID  . sodium chloride flush  3 mL Intravenous Q12H    Antimicrobials: Anti-infectives (From admission, onward)   Start     Dose/Rate Route Frequency Ordered Stop   11/19/19 1700  vancomycin (VANCOREADY) IVPB 2000 mg/400 mL     Discontinue     2,000 mg 200 mL/hr over 120 Minutes Intravenous Every 48 hours 11/17/19 1859     11/17/19 2100  piperacillin-tazobactam (ZOSYN) IVPB 3.375 g     Discontinue     3.375 g 12.5 mL/hr over 240 Minutes Intravenous Every 8 hours 11/17/19 1859     11/17/19 2015  piperacillin-tazobactam (ZOSYN) IVPB 3.375 g  Status:  Discontinued        3.375 g 100 mL/hr over 30 Minutes Intravenous  Once 11/17/19 1915 11/17/19 1920   11/17/19 2015  vancomycin (VANCOCIN) IVPB 1000 mg/200 mL premix  Status:  Discontinued        1,000 mg 200 mL/hr over 60 Minutes Intravenous  Once 11/17/19 1915 11/17/19 1920   11/17/19 1500  vancomycin (VANCOREADY) IVPB 2000 mg/400 mL        2,000 mg 200 mL/hr over 120 Minutes Intravenous  Once 11/17/19 1411 11/17/19 2137   11/17/19 1415  ceFEPIme (MAXIPIME) 2 g in sodium chloride 0.9 % 100 mL IVPB        2 g 200 mL/hr over 30 Minutes Intravenous  Once 11/17/19 1407 11/17/19 1523   11/17/19 1415  metroNIDAZOLE (FLAGYL) IVPB 500 mg        500 mg 100 mL/hr over 60 Minutes Intravenous  Once 11/17/19 1407 11/17/19 1628   11/17/19 1415  vancomycin (VANCOCIN) IVPB 1000 mg/200 mL premix  Status:  Discontinued        1,000 mg 200 mL/hr over 60 Minutes Intravenous  Once 11/17/19 1407 11/17/19 1409      PRN meds: hydrALAZINE, ibuprofen, magic mouthwash w/lidocaine, magnesium hydroxide, ondansetron **OR** ondansetron (ZOFRAN) IV, oxyCODONE, phenol, sodium chloride flush, sorbitol   Objective: Vitals:   11/18/19 0234 11/18/19 0622  BP:  (!) 97/59 100/69  Pulse: 82 81  Resp: 18 20  Temp: 97.9 F (36.6 C) (!) 97.5 F (36.4 C)  SpO2: 96% 99%    Intake/Output Summary (Last 24 hours) at 11/18/2019 1101 Last data filed at 11/18/2019 0800 Gross per 24 hour  Intake 300.29 ml  Output 200 ml  Net 100.29 ml   Filed Weights   11/17/19 1329  Weight: Marland Kitchen)  156.5 kg   Weight change:  Body mass index is 55.68 kg/m.   Physical Exam: General exam: Appears calm and comfortable.  Not in physical distress Skin: No rashes, lesions or ulcers. HEENT: Atraumatic, normocephalic, supple neck, no obvious bleeding Lungs: Clear to auscultation bilaterally CVS: Regular rate and rhythm, no murmur GI/Abd soft, nontender, distended from obesity. CNS: Alert, awake, oriented x3 Psychiatry: Depressed look Extremities: Chronic bilateral lower extremity lymphedema with chronic stasis changes, both legs have bandages on, changed this morning by wound care nurse..  Data Review: I have personally reviewed the laboratory data and studies available.  Recent Labs  Lab 11/17/19 1430 11/18/19 0437  WBC 26.2* 25.4*  NEUTROABS 22.7* 21.1*  HGB 9.3* 8.4*  HCT 30.8* 28.7*  MCV 91.9 93.8  PLT 523* 437*   Recent Labs  Lab 11/17/19 1430 11/18/19 0437  NA 138 136  K 4.1 3.7  CL 109 114*  CO2 16* 12*  GLUCOSE 151* 167*  BUN 46* 48*  CREATININE 2.43* 2.25*  CALCIUM 8.5* 7.6*  MG  --  1.4*  PHOS  --  5.0*    Signed, Lorin GlassBinaya Day Greb, MD [facility] Pager: [phone] (Secure Chat preferred). 11/18/2019

## 2019-11-18 NOTE — Evaluation (Signed)
Occupational Therapy Evaluation Patient Details Name: Chelsea Bolton MRN: [id number] DOB: 1960/09/19 Today's Date: 11/18/2019    History of Present Illness Chelsea Bolton is a 59 y.o. female with PMH significant for T2DM, severe morbid obesity with BMI 56, OSA on nightly BiPAP, GERD, chronic right lower extremity cellulitis, hypothyroidism.Patient was sent [facility] ED today from rehab at [facility] with an episode of dizziness, hypotension and worsening right lower extremity cellulitis.Hospitalized at [facility] from 6/1-6/16 for septic shock secondary to right lower extremity colitis.  She briefly required intubation, successfully extubated.  Because of profound acidosis, she also required 2 sessions of dialysis.  Respiratory status and renal function were back to baseline at the time of discharge   Clinical Impression   Patient with functional deficits listed below impacting safety and independence with self care. Patient having significant pain in R heel, requiring multiple attempts to stand from recliner chair with max A x2 and max cues for safety, body mechanics and use of AD (walker). Patient max A x2 stand pivot to bedside commode and back to recliner to eat breakfast. Will continue to follow.    Follow Up Recommendations  SNF    Equipment Recommendations  Other (comment);Wheelchair (measurements OT) (walker)       Precautions / Restrictions Precautions Precautions: Fall Precaution Comments: R heel sore, painful Restrictions Weight Bearing Restrictions: No Other Position/Activity Restrictions: unsure about weight on right heel      Mobility Bed Mobility Overal bed mobility: Needs Assistance Bed Mobility: Sit to Supine       Sit to supine: Max assist;+2 for physical assistance;+2 for safety/equipment   General bed mobility comments: OOB in recliner upon arrival  Transfers Overall transfer level: Needs assistance Equipment used: Rolling walker (2  wheeled);2 person hand held assist Transfers: Sit to/from [employer] Sit to Stand: Max assist;+2 physical assistance;+2 safety/equipment Stand pivot transfers: Max assist;+2 physical assistance;+2 safety/equipment       General transfer comment: pt require multiple attempts and max cues before standing from recliner to transfer to bedside commode. decreased safety awareness    Balance Overall balance assessment: Needs assistance Sitting-balance support: No upper extremity supported;Feet supported Sitting balance-Leahy Scale: Fair     Standing balance support: Bilateral upper extremity supported;During functional activity Standing balance-Leahy Scale: Poor Standing balance comment: decreased WB on right so balance is tenuous                           ADL either performed or assessed with clinical judgement   ADL Overall ADL's : Needs assistance/impaired Eating/Feeding: Independent;Sitting   Grooming: Set up;Sitting   Upper Body Bathing: Minimal assistance;Sitting   Lower Body Bathing: Moderate assistance;Sitting/lateral leans;Sit to/from stand   Upper Body Dressing : Minimal assistance;Sitting   Lower Body Dressing: Moderate assistance;Sitting/lateral leans;Sit to/from stand   Toilet Transfer: Maximal assistance;+2 for physical assistance;+2 for safety/equipment;Cueing for safety;Cueing for sequencing;Stand-pivot;BSC;RW Toilet Transfer Details (indicate cue type and reason): multimodal cues for body mechanics and to utilize walker to alleviate pressure through R LE/heel however patient stating "it won't work" also requiring hand held assistance Toileting- Architect and Hygiene: Set up;Sitting/lateral lean Toileting - Clothing Manipulation Details (indicate cue type and reason): peri care after voiding     Functional mobility during ADLs: Maximal assistance;+2 for physical assistance;+2 for safety/equipment;Rolling walker;Cueing for  safety;Cueing for sequencing General ADL Comments: patient requiring increased assistance with self care and functional transfers due to pain, decreased activity  tolerance, safety awareness                  Pertinent Vitals/Pain Pain Assessment: Faces Faces Pain Scale: Hurts worst Pain Location: right heel. Pain Descriptors / Indicators: Burning;Grimacing;Moaning;Crying Pain Intervention(s): Monitored during session     Hand Dominance Right   Extremity/Trunk Assessment Upper Extremity Assessment Upper Extremity Assessment: Generalized weakness   Lower Extremity Assessment Lower Extremity Assessment: Defer to PT evaluation RLE Deficits / Details: leg wrapped, does bear a little weight to stand and pivot. LLE Deficits / Details: generalized weakness, able to stand and bear weight   Cervical / Trunk Assessment Cervical / Trunk Assessment: Normal   Communication Communication Communication: No difficulties   Cognition Arousal/Alertness: Awake/alert Behavior During Therapy: Anxious Overall Cognitive Status: Within Functional Limits for tasks assessed                                 General Comments: patient gets tearful with prospect of movement getting to commode, then back. frequently asks "what are we doing now"              Home Living Family/patient expects to be discharged to:: Inpatient rehab [facility]) Living Arrangements: Parent                               Additional Comments: plans to Dc to mother's home [facility] with 1 STE      Prior Functioning/Environment Level of Independence: Independent with assistive device(s)        Comments: with [facility]PC prior to hospitalization in 6/21        OT Problem List: Decreased strength;Decreased activity tolerance;Impaired balance (sitting and/or standing);Decreased safety awareness;Decreased knowledge of use of DME or AE;Obesity;Pain      OT Treatment/Interventions: Self-care/ADL  training;Therapeutic exercise;Energy conservation;DME and/or AE instruction;Therapeutic activities;Patient/family education;Balance training    OT Goals(Current goals can be found in the care plan section) Acute Rehab OT Goals Patient Stated Goal: go to rehab then home OT Goal Formulation: With patient Time For Goal Achievement: 12/02/19 Potential to Achieve Goals: Good  OT Frequency: Min 2X/week           Co-evaluation PT/OT/SLP Co-Evaluation/Treatment: Yes Reason for Co-Treatment: For patient/therapist safety;To address functional/ADL transfers   OT goals addressed during session: ADL's and self-care      AM-PAC OT "6 Clicks" Daily Activity     Outcome Measure Help from another person eating meals?: None Help from another person taking care of personal grooming?: A Little Help from another person toileting, which includes using toliet, bedpan, or urinal?: A Lot Help from another person bathing (including washing, rinsing, drying)?: A Lot Help from another person to put on and taking off regular upper body clothing?: A Little Help from another person to put on and taking off regular lower body clothing?: A Lot 6 Click Score: 16   End of Session Equipment Utilized During Treatment: Rolling walker Nurse Communication: Mobility status  Activity Tolerance: Patient limited by pain Patient left: in chair;with call bell/phone within reach  OT Visit Diagnosis: Unsteadiness on feet (R26.81);Pain Pain - Right/Left: Right Pain - part of body: Ankle and joints of foot;Leg                Time: 1610-9604 OT Time Calculation (min): 15 min Charges:  OT General Charges $OT Visit: 1 Visit OT Evaluation $OT Eval Low Complexity:  1 Low  Marlyce Huge OT Pager: [phone]  Carmelia Roller 11/18/2019, 11:57 AM

## 2019-11-18 NOTE — Progress Notes (Signed)
Physical Therapy Treatment Patient Details Name: Chelsea Bolton MRN: [id number] DOB: [birth year] Today's Date: 11/18/2019    History of Present Illness Chelsea Bolton is a 59 y.o. female with PMH significant for T2DM, severe morbid obesity with BMI 56, OSA on nightly BiPAP, GERD, chronic right lower extremity cellulitis, hypothyroidism.Patient was sent [facility] ED today from rehab at [facility] with an episode of dizziness, hypotension and worsening right lower extremity cellulitis.Hospitalized at [facility] from 6/1-6/16 for septic shock secondary to right lower extremity colitis.  She briefly required intubation, successfully extubated.  Because of profound acidosis, she also required 2 sessions of dialysis.  Respiratory status and renal function were back to baseline at the time of discharge    PT Comments    Patient asking to return to bed after less than 1 hour. Continues with right heel pain complaints. Max assist of 2 to stand and pivot from recliner to bed. Continue PT.  Follow Up Recommendations  SNF     Equipment Recommendations  None recommended by PT    Recommendations for Other Services       Precautions / Restrictions Precautions Precautions: Fall Precaution Comments: R heel sore, painful Restrictions Other Position/Activity Restrictions: unsure about weight on right heel    Mobility  Bed Mobility Overal bed mobility: Needs Assistance Bed Mobility: Sit to Supine       Sit to supine: Max assist;+2 for physical assistance;+2 for safety/equipment   General bed mobility comments: patient required assistance with legs, trunk flopped to bed with decreased control  Transfers Overall transfer level: Needs assistance Equipment used: Rolling walker (2 wheeled);2 person hand held assist Transfers: Sit to/from [employer] Sit to Stand: Max assist;+2 physical assistance;+2 safety/equipment Stand pivot transfers: Max assist;+2 physical  assistance;+2 safety/equipment       General transfer comment: multiple attempts amd mutimodal cues to stand and pivot to bed from recliner. Decreased weight tolerated on right heel.  Ambulation/Gait                 Stairs             Wheelchair Mobility    Modified Rankin (Stroke Patients Only)       Balance Overall balance assessment: Needs assistance Sitting-balance support: No upper extremity supported;Feet supported Sitting balance-Leahy Scale: Fair     Standing balance support: Bilateral upper extremity supported;During functional activity Standing balance-Leahy Scale: Poor Standing balance comment: decreased WB on right so balance is tenuous                            Cognition Arousal/Alertness: Awake/alert Behavior During Therapy: Anxious;WFL for tasks assessed/performed Overall Cognitive Status: Within Functional Limits for tasks assessed                                        Exercises      General Comments        Pertinent Vitals/Pain Pain Assessment: Faces Faces Pain Scale: Hurts worst Pain Location: right heel. Pain Descriptors / Indicators: Burning;Grimacing;Moaning;Crying Pain Intervention(s): Limited activity within patient's tolerance    Home Living Family/patient expects to be discharged to:: Inpatient rehab ([place] [facility]) Living Arrangements: Parent             Additional Comments: plans to Dc to mother's home    Prior Function Level of Independence: Independent with  assistive device(s)      Comments: with [facility]PC prior to hospitalization in 6/21   PT Goals (current goals can now be found in the care plan section) Acute Rehab PT Goals Patient Stated Goal: go to rehab then home PT Goal Formulation: With patient Time For Goal Achievement: 12/02/19 Potential to Achieve Goals: Good Progress towards PT goals: Progressing toward goals    Frequency    Min 2X/week      PT Plan Current plan  remains appropriate    Co-evaluation              AM-PAC PT "6 Clicks" Mobility   Outcome Measure  Help needed turning from your back to your side while in a flat bed without using bedrails?: A Lot Help needed moving from lying on your back to sitting on the side of a flat bed without using bedrails?: A Lot Help needed moving to and from a bed to a chair (including a wheelchair)?: Total Help needed standing up from a chair using your arms (e.g., wheelchair or bedside chair)?: Total Help needed to walk in hospital room?: Total Help needed climbing 3-5 steps with a railing? : Total 6 Click Score: 8    End of Session   Activity Tolerance: Patient limited by pain Patient left: in bed;with call bell/phone within reach;with nursing/sitter in room Nurse Communication: Mobility status PT Visit Diagnosis: Unsteadiness on feet (R26.81);Difficulty in walking, not elsewhere classified (R26.2);Pain Pain - Right/Left: Right Pain - part of body: Leg;Ankle and joints of foot     Time: 4967-5916 PT Time Calculation (min) (ACUTE ONLY): 9 min  Charges:  $Therapeutic Activity: 8-22 mins                     Blanchard Kelch PT Acute Rehabilitation Services Pager [phone] Office [phone]    Rada Hay 11/18/2019, 10:25 AM

## 2019-11-18 NOTE — Progress Notes (Signed)
Inpatient Diabetes Program Recommendations  AACE/ADA: New Consensus Statement on Inpatient Glycemic Control (2015)  Target Ranges:  Prepandial:   less than 140 mg/dL      Peak postprandial:   less than 180 mg/dL (1-2 hours)      Critically ill patients:  140 - 180 mg/dL   Lab Results  Component Value Date   GLUCAP 152 (H) 11/18/2019   HGBA1C 6.8 (H) 11/17/2019    Review of Glycemic Control  Diabetes history: DM2 Outpatient Diabetes medications: Lantus 22 units QD, Humalog 2-10 units tidwc and hs Current orders for Inpatient glycemic control: Novolog 0-9 units tidwc and hs, metformin 1000 mg bid  HgbA1C - 6.8% - likely not accurate d/t low H/H.  Eating 25% of meals Blood sugars all < 180 mg/dL.  Spoke with pt briefly at bedside. States she checks blood sugars daily and takes Lantus 22 units QD, along with s/s Humalog. States she felt bad and could not talk.  Inpatient Diabetes Program Recommendations:     Agree with Novolog 0-9 units tidwc and hs If FBS > 180 mg/dL, add Lantus 10 units QD   Will follow daily.   Thank you. Ailene Ards, RD, LDN, CDE Inpatient Diabetes Coordinator [phone]

## 2019-11-18 NOTE — Evaluation (Addendum)
Physical Therapy Evaluation Patient Details Name: Chelsea Bolton MRN: [id number] DOB: June 01, 1960 Today's Date: 11/18/2019   History of Present Illness  Chelsea Bolton is a 59 y.o. female with PMH significant for T2DM, severe morbid obesity with BMI 56, OSA on nightly BiPAP, GERD, chronic right lower extremity cellulitis, hypothyroidism.Patient was sent [facility] ED today from rehab at [facility] with an episode of dizziness, hypotension and worsening right lower extremity cellulitis.Hospitalized at [facility] from 6/1-6/16 for septic shock secondary to right lower extremity colitis.  She briefly required intubation, successfully extubated.  Because of profound acidosis, she also required 2 sessions of dialysis.  Respiratory status and renal function were back to baseline at the time of discharge  Clinical Impression  The  Patient is very anxious, requiring 2 max assist to stand from recliner and BSc, and to pivot to each surface. Patient reports ambulating at SNF. Today, reports  The right heel is very painful, making standing very difficult and unsafe. Plans are to return to facility to finish  Rehab. Pt admitted with above diagnosis.  Pt currently with functional limitations due to the deficits listed below (see PT Problem List). Pt will benefit from skilled PT to increase their independence and safety with mobility to allow discharge to the venue listed below.       Follow Up Recommendations SNF    Equipment Recommendations  None recommended by PT    Recommendations for Other Services       Precautions / Restrictions Precautions Precautions: Fall Precaution Comments: R heel sore, painful Restrictions Other Position/Activity Restrictions: unsure about weight on right heel      Mobility  Bed Mobility               General bed mobility comments: in recliner  Transfers Overall transfer level: Needs assistance Equipment used: Rolling walker (2 wheeled);1  person hand held assist;2 person hand held assist Transfers: Sit to/from [employer] Sit to Stand: Max assist;+2 physical assistance;+2 safety/equipment Stand pivot transfers: Max assist;+2 physical assistance;+2 safety/equipment       General transfer comment: multiple attempts to stand from recliner, multimodal cues for hand placement. Patient required max assist from recliner and BSC. Patient very anxious, stating that she cannot bear weight on the right leg.Patient  slowly able to take small steps to [facility]. Slightly less effort to stand from BSC(higher surface). Patient pivoted back to recliner.  Ambulation/Gait                Stairs            Wheelchair Mobility    Modified Rankin (Stroke Patients Only)       Balance Overall balance assessment: Needs assistance Sitting-balance support: No upper extremity supported;Feet supported Sitting balance-Leahy Scale: Fair     Standing balance support: Bilateral upper extremity supported;During functional activity Standing balance-Leahy Scale: Poor Standing balance comment: decreased WB on right so balance is tenuous                             Pertinent Vitals/Pain Pain Assessment: Faces Faces Pain Scale: Hurts worst Pain Location: right heel. Pain Descriptors / Indicators: Burning;Grimacing;Moaning;Crying Pain Intervention(s): Monitored during session;Limited activity within patient's tolerance;Repositioned    Home Living Family/patient expects to be discharged to:: Inpatient rehab ([facility]) Living Arrangements: Parent               Additional Comments: plans to Dc to mother's home  Prior Function Level of Independence: Independent with assistive device(s)         Comments: with SPC prior to hospitalization in 6/21     Hand Dominance   Dominant Hand: Right    Extremity/Trunk Assessment        Lower Extremity Assessment Lower Extremity Assessment: RLE  deficits/detail;LLE deficits/detail RLE Deficits / Details: leg wrapped, does bear a little weight to stand and pivot. LLE Deficits / Details: generalized weakness, able to stand and bear weight    Cervical / Trunk Assessment Cervical / Trunk Assessment: Normal  Communication   Communication: No difficulties  Cognition Arousal/Alertness: Awake/alert Behavior During Therapy: Anxious Overall Cognitive Status: Within Functional Limits for tasks assessed                                        General Comments      Exercises     Assessment/Plan    PT Assessment Patient needs continued PT services  PT Problem List Decreased strength;Decreased knowledge of precautions;Decreased mobility;Obesity;Decreased activity tolerance;Decreased safety awareness;Decreased skin integrity;Pain       PT Treatment Interventions DME instruction;Therapeutic activities;Gait training;Therapeutic exercise;Patient/family education;Functional mobility training    PT Goals (Current goals can be found in the Care Plan section)  Acute Rehab PT Goals Patient Stated Goal: go to rehab then home PT Goal Formulation: With patient Time For Goal Achievement: 12/02/19 Potential to Achieve Goals: Good    Frequency Min 2X/week   Barriers to discharge        Co-evaluation  yes, for patient and therapists safety, for mobility and ADL's             AM-PAC PT "6 Clicks" Mobility  Outcome Measure Help needed turning from your back to your side while in a flat bed without using bedrails?: A Lot Help needed moving from lying on your back to sitting on the side of a flat bed without using bedrails?: A Lot Help needed moving to and from a bed to a chair (including a wheelchair)?: Total Help needed standing up from a chair using your arms (e.g., wheelchair or bedside chair)?: Total Help needed to walk in hospital room?: Total Help needed climbing 3-5 steps with a railing? : Total 6 Click Score:  8    End of Session   Activity Tolerance: Patient limited by pain Patient left: in chair;with call bell/phone within reach Nurse Communication: Mobility status PT Visit Diagnosis: Unsteadiness on feet (R26.81);Difficulty in walking, not elsewhere classified (R26.2);Pain Pain - Right/Left: Right Pain - part of body: Leg;Ankle and joints of foot    Time: 0911-0921 PT Time Calculation (min) (ACUTE ONLY): 10 min   Charges:   PT Evaluation $PT Eval Low Complexity: 1 Low          Blanchard Kelch PT Acute Rehabilitation Services Pager [phone] Office [phone]   Rada Hay 11/18/2019, 10:18 AM

## 2019-11-18 NOTE — Consult Note (Addendum)
WOC Nurse Consult Note: Reason for Consult: Consult requested for right leg.  Pt has generalized edema and erythremia and weeping; appearance is consistent with cellulitis.  MRI does not indicate osteomyelitis but pt has pronounced Charcot foot.  Wound type: Right upper anterior calf with full thickness wound; 5X5cm, 90% slough/eschar, 10% red, mod amt tan drainage, no odor Right heel with dark purple red deep tissue pressure injury; 4X4cm Right anterior foot with patchy areas of red moist partial thickness wounds from previous weeping; mod amt yellow drainage Right plantar middle foot with full thickness wound; .8X.8X.2cm, red and moist Right anterior plantar foot with full thickness wound; 1X1X.2cm, red and moist Pressure Injury POA: Yes Dressing procedure/placement/frequency: Topical treatment orders provided for bedside nurses to perform daily as follows to protect and promote healing and provide enzymatic debridement: Float heels to reduce pressure. Apply Santyl to right anterior leg wound Q day, then cover with moist gauze and ABD pad. Apply Xeroform gauze to right anterior foot wounds and right heel wound Q day, then cover with ABD pad and kerlex in a spiral fashion, beginning just behind the toes, to below the knee.  Apply ace wrap in the same manner.  Pt could benefit from follow-up with ortho service for continued assessment of Charcot foot and wounds after discharge.  Please re-consult if further assistance is needed.  Thank-you,  Cammie Mcgee MSN, RN, CWOCN, [place], CNS [phone]

## 2019-11-19 DIAGNOSIS — K1379 Other lesions of oral mucosa: Secondary | ICD-10-CM

## 2019-11-19 DIAGNOSIS — E039 Hypothyroidism, unspecified: Secondary | ICD-10-CM

## 2019-11-19 DIAGNOSIS — E11621 Type 2 diabetes mellitus with foot ulcer: Secondary | ICD-10-CM

## 2019-11-19 LAB — BASIC METABOLIC PANEL
Anion gap: 10 (ref 5–15)
BUN: 43 mg/dL — ABNORMAL HIGH (ref 6–20)
CO2: 15 mmol/L — ABNORMAL LOW (ref 22–32)
Calcium: 7.4 mg/dL — ABNORMAL LOW (ref 8.9–10.3)
Chloride: 113 mmol/L — ABNORMAL HIGH (ref 98–111)
Creatinine, Ser: 1.76 mg/dL — ABNORMAL HIGH (ref 0.44–1.00)
GFR calc Af Amer: 36 mL/min — ABNORMAL LOW (ref >60)
GFR calc non Af Amer: 31 mL/min — ABNORMAL LOW (ref >60)
Glucose, Bld: 127 mg/dL — ABNORMAL HIGH (ref 70–99)
Potassium: 3.4 mmol/L — ABNORMAL LOW (ref 3.5–5.1)
Sodium: 138 mmol/L (ref 135–145)

## 2019-11-19 LAB — CBC WITH DIFFERENTIAL/PLATELET
Abs Immature Granulocytes: 0.68 10*3/uL — ABNORMAL HIGH (ref 0.00–0.07)
Basophils Absolute: 0.2 10*3/uL — ABNORMAL HIGH (ref 0.0–0.1)
Basophils Relative: 1 %
Eosinophils Absolute: 0.4 10*3/uL (ref 0.0–0.5)
Eosinophils Relative: 2 %
HCT: 26.4 % — ABNORMAL LOW (ref 36.0–46.0)
Hemoglobin: 7.9 g/dL — ABNORMAL LOW (ref 12.0–15.0)
Immature Granulocytes: 3 %
Lymphocytes Relative: 8 %
Lymphs Abs: 1.5 10*3/uL (ref 0.7–4.0)
MCH: 27.2 pg (ref 26.0–34.0)
MCHC: 29.9 g/dL — ABNORMAL LOW (ref 30.0–36.0)
MCV: 91 fL (ref 80.0–100.0)
Monocytes Absolute: 1.2 10*3/uL — ABNORMAL HIGH (ref 0.1–1.0)
Monocytes Relative: 6 %
Neutro Abs: 16 10*3/uL — ABNORMAL HIGH (ref 1.7–7.7)
Neutrophils Relative %: 80 %
Platelets: 481 10*3/uL — ABNORMAL HIGH (ref 150–400)
RBC: 2.9 MIL/uL — ABNORMAL LOW (ref 3.87–5.11)
RDW: 18 % — ABNORMAL HIGH (ref 11.5–15.5)
WBC: 19.8 10*3/uL — ABNORMAL HIGH (ref 4.0–10.5)
nRBC: 0 % (ref 0.0–0.2)

## 2019-11-19 LAB — GLUCOSE, CAPILLARY
Glucose-Capillary: 131 mg/dL — ABNORMAL HIGH (ref 70–99)
Glucose-Capillary: 139 mg/dL — ABNORMAL HIGH (ref 70–99)
Glucose-Capillary: 169 mg/dL — ABNORMAL HIGH (ref 70–99)
Glucose-Capillary: 119 mg/dL — ABNORMAL HIGH (ref 70–99)

## 2019-11-19 LAB — MAGNESIUM: Magnesium: 1.8 mg/dL (ref 1.7–2.4)

## 2019-11-19 LAB — PHOSPHORUS: Phosphorus: 5 mg/dL — ABNORMAL HIGH (ref 2.5–4.6)

## 2019-11-19 MED ORDER — LIDOCAINE VISCOUS HCL 2 % MT SOLN
15.0000 mL | Freq: Three times a day (TID) | OROMUCOSAL | Status: DC | PRN
  Administered 2019-11-21: 15 mL via OROMUCOSAL
  Filled 2019-11-19 (×2): qty 15

## 2019-11-19 MED ORDER — MAGIC MOUTHWASH W/LIDOCAINE
15.0000 mL | Freq: Three times a day (TID) | ORAL | Status: DC | PRN
  Administered 2019-11-19 – 2019-11-23 (×5): 15 mL via ORAL
  Filled 2019-11-19 (×7): qty 15

## 2019-11-19 MED ORDER — VANCOMYCIN HCL 1750 MG/350ML IV SOLN
1750.0000 mg | INTRAVENOUS | Status: DC
  Administered 2019-11-19 – 2019-11-21 (×3): 1750 mg via INTRAVENOUS
  Filled 2019-11-19 (×4): qty 350

## 2019-11-19 NOTE — Progress Notes (Signed)
Pharmacy Antibiotic Note  Chelsea Bolton is a 59 y.o. female recently treated for RLE cellulitis/wound at [facility], presented to the ED from nursing facility on 11/17/2019 with hypotension and SOB.  Pharmacy is consulted to start vancomycin and zosyn for sepsis and LE cellulitis.  D3 Abxs - Vanc/Zosyn Afebrile WBC 19.8 trending down SCr 1.76 improved from 2.25 Culture's no growth to date  Plan:  With improved SCr, change vanc per nomogram from 2g IV q48 to 1750mg  IV q24  Continue current Zosyn dosing  Daily Scr ______________________________________  Height: 5\' 6"  (167.6 cm) Weight: (!) 156.5 kg (345 lb) IBW/kg (Calculated) : 59.3  Temp (24hrs), Avg:98.2 F (36.8 C), Min:97.5 F (36.4 C), Max:99.1 F (37.3 C)  Recent Labs  Lab 11/17/19 1430 11/17/19 1515 11/18/19 0437 11/19/19 0325  WBC 26.2*  --  25.4* 19.8*  CREATININE 2.43*  --  2.25* 1.76*  LATICACIDVEN  --  1.4  --   --     Estimated Creatinine Clearance: 53.4 mL/min (A) (by C-G formula based on SCr of 1.76 mg/dL (H)).    Allergies  Allergen Reactions  . Ace Inhibitors     Other reaction(s): Cough (ALLERGY/intolerance)  . Amlodipine     Other reaction(s): Other (See Comments) Excessive fluid     Thank you for allowing pharmacy to be a part of this patient's care.  01/19/20 11/19/2019 10:08 AM

## 2019-11-19 NOTE — Assessment & Plan Note (Addendum)
-   source again presumed to be RLE - MRI foot done on 7/5; no abscess or osteo - continue abx, will de-escalate as able  - see UTI and cellulitis

## 2019-11-19 NOTE — Assessment & Plan Note (Signed)
-   continue SSI and POC glucose

## 2019-11-19 NOTE — Hospital Course (Addendum)
Chelsea Bolton is a 59 y.o. female with PMH significant for T2DM, severe morbid obesity with BMI 56, OSA on nightly BiPAP, GERD, chronic right lower extremity cellulitis, hypothyroidism. Patient was sent [facility] ED from rehab at [facility] with an episode of dizziness, hypotension and worsening right lower extremity cellulitis.  She has had recent hospitalization at [facility] in June 2021 for septic shock due to recurrent right lower extremity cellulitis.  She completed a 2-week course of Ancef per ID.  She again presents back with worsening erythema and pain in her right lower extremity.  She endorsed ongoing serous drainage of fluid from her legs as well, worse than her right. She was again found to be septic on admission due to right lower extremity cellulitis.  She was started on vancomycin and Zosyn. After she continued to improve she was transitioned to Vanc and Rocephin. Urine culture further speciated to Citrobacter and Pseudomonas, both sensitive to Cipro which was started on 7/10. She had no significant urinary sxms and despite possibility of colonization, this was elected to be treated given her clinical presentation and sepsis on admission.  Vanc was de-escalated to Bactrim after 5 days IV Vanc.   She had good clinical improvement overall.  She grew anxious and impatient  to wait for insurance authorization for returning back to her SNF and therefore elected to return home.  She was offered multiple times to continue remaining and waiting in the hospital for insurance approval however declined and wished to go home instead. She was arranged for outpatient wound care as well for treatment of her lower extremity ulcers.  The earliest date was August 12 and again she was given the option to be able to remain hospitalized until discharged to SNF for ongoing care, but again declined and wished to go home.  Social work was also unable to obtain home health agencies due to  not being within the patient's insurance network.

## 2019-11-19 NOTE — TOC Progression Note (Signed)
Transition of Care [facility]) - Progression Note    Patient Details  Name: Chelsea Bolton MRN: [id number] Date of Birth: 11/02/1960  Transition of Care [facility]) CM/SW Contact  Geni Bers, RN Phone Number: 11/19/2019, 2:25 PM  Clinical Narrative:     Pt states that want to return to [facility]. A call was made to admission coordinator at [facility] who states that pt may return, starting insurance auth.   Expected Discharge Plan: Skilled Nursing Facility Barriers to Discharge: No Barriers Identified  Expected Discharge Plan and Services Expected Discharge Plan: Skilled Nursing Facility   Discharge Planning Services: CM Consult Post Acute Care Choice: Skilled Nursing Facility Living arrangements for the past 2 months: Skilled Nursing Facility                                       Social Determinants of Health (SDOH) Interventions    Readmission Risk Interventions No flowsheet data found.

## 2019-11-19 NOTE — Assessment & Plan Note (Addendum)
-   continue MMW - lidocaine PRN as well

## 2019-11-19 NOTE — NC FL2 (Signed)
[place] MEDICAID FL2 LEVEL OF CARE SCREENING TOOL     IDENTIFICATION  Patient Name: Chelsea Bolton Birthdate: 09/30/1960 Sex: female Admission Date (Current Location): 11/17/2019  [facility] and [place] Number:  Producer, television/film/video and Address:  [facility]  [address], [place] [address]      Provider Number: [id number]  Attending Physician Name and Address:  Lewie Chamber, MD  Relative Name and Phone Number:  Lanetta Inch [phone] mother    Current Level of Care: Hospital Recommended Level of Care: Skilled Nursing Facility Prior Approval Number:    Date Approved/Denied:   PASRR Number: [id number]  Discharge Plan: SNF    Current Diagnoses: Patient Active Problem List   Diagnosis Date Noted   Sepsis (HCC) 11/17/2019    Orientation RESPIRATION BLADDER Height & Weight     Self, Time, Situation, Place  Normal Continent, Indwelling catheter (Foley for urinary retention) Weight: (!) 156.5 kg Height:  5\' 6"  (167.6 cm)  BEHAVIORAL SYMPTOMS/MOOD NEUROLOGICAL BOWEL NUTRITION STATUS      Continent Diet (Heart Healthy)  AMBULATORY STATUS COMMUNICATION OF NEEDS Skin   Extensive Assist Verbally Other (Comment) (Dry, Flaky, Cellulitis, Moisture Associated skin damaged, Bilateral upper & lower legs swelling)                       Personal Care Assistance Level of Assistance  Bathing, Feeding, Dressing Bathing Assistance: Limited assistance Feeding assistance: Independent Dressing Assistance: Limited assistance     Functional Limitations Info  Sight, Hearing, Speech Sight Info: Impaired Hearing Info: Adequate Speech Info: Adequate    SPECIAL CARE FACTORS FREQUENCY  PT (By licensed PT), OT (By licensed OT)     PT Frequency: Eval and Treat OT Frequency: Eval and Treat            Contractures Contractures Info: Not present    Additional Factors Info  Code Status, Allergies Code Status Info: FULL Allergies Info: Ace Inhibitors,  Amlodipine           Current Medications (11/19/2019):  This is the current hospital active medication list Current Facility-Administered Medications  Medication Dose Route Frequency Provider Last Rate Last Admin   0.9 %  sodium chloride infusion   Intravenous Continuous Dahal, Binaya, MD 100 mL/hr at 11/19/19 1043 New Bag at 11/19/19 1043   buPROPion (WELLBUTRIN XL) 24 hr tablet 300 mg  300 mg Oral Daily Dahal, 01/20/20, MD   300 mg at 11/19/19 0916   calcium-vitamin D (OSCAL WITH D) 500-200 MG-UNIT per tablet 1 tablet  1 tablet Oral BID 01/20/20, FNP   1 tablet at 11/19/19 01/20/20   Chlorhexidine Gluconate Cloth 2 % PADS 6 each  6 each Topical Daily Dahal, 3532, MD   6 each at 11/19/19 1321   collagenase (SANTYL) ointment   Topical Daily 01/20/20, MD   Given at 11/19/19 0917   gabapentin (NEURONTIN) capsule 600 mg  600 mg Oral TID 01/20/20, MD   600 mg at 11/19/19 0916   heparin injection 5,000 Units  5,000 Units Subcutaneous Q8H Dahal, 01/20/20, MD   5,000 Units at 11/19/19 1316   hydrALAZINE (APRESOLINE) injection 10 mg  10 mg Intravenous Q6H PRN Dahal, 01/20/20, MD       ibuprofen (ADVIL) tablet 400 mg  400 mg Oral Q6H PRN Dahal, Binaya, MD       insulin aspart (novoLOG) injection 0-5 Units  0-5 Units Subcutaneous QHS Melina Schools, MD  insulin aspart (novoLOG) injection 0-9 Units  0-9 Units Subcutaneous TID WC Dahal, Melina Schools, MD   1 Units at 11/19/19 1317   levothyroxine (SYNTHROID) tablet 100 mcg  100 mcg Oral QAC breakfast Dahal, Melina Schools, MD   100 mcg at 11/19/19 0543   lip balm (CARMEX) ointment 1 application  1 application Topical PRN Dahal, Melina Schools, MD       liver oil-zinc oxide (DESITIN) 40 % ointment   Topical TID Lorin Glass, MD   Given at 11/19/19 3299   magic mouthwash w/lidocaine  5 mL Oral TID PRN Lorin Glass, MD   5 mL at 11/19/19 0917   magnesium hydroxide (MILK OF MAGNESIA) suspension 30 mL  30 mL Oral Daily PRN Dahal, Melina Schools, MD        ondansetron (ZOFRAN) tablet 4 mg  4 mg Oral Q6H PRN Dahal, Melina Schools, MD       Or   ondansetron (ZOFRAN) injection 4 mg  4 mg Intravenous Q6H PRN Dahal, Melina Schools, MD       oxyCODONE (Oxy IR/ROXICODONE) immediate release tablet 5 mg  5 mg Oral Q4H PRN Dahal, Melina Schools, MD   5 mg at 11/19/19 1045   pantoprazole (PROTONIX) EC tablet 40 mg  40 mg Oral Daily Dahal, Melina Schools, MD   40 mg at 11/19/19 0916   phenol (CHLORASEPTIC) mouth spray 1 spray  1 spray Mouth/Throat PRN Dahal, Melina Schools, MD   1 spray at 11/18/19 0157   piperacillin-tazobactam (ZOSYN) IVPB 3.375 g  3.375 g Intravenous Q8H Pham, Anh P, RPH 12.5 mL/hr at 11/19/19 1318 3.375 g at 11/19/19 1318   senna (SENOKOT) tablet 8.6 mg  1 tablet Oral BID Dahal, Melina Schools, MD       sodium chloride flush (NS) 0.9 % injection 10-40 mL  10-40 mL Intracatheter PRN Dahal, Binaya, MD       sodium chloride flush (NS) 0.9 % injection 3 mL  3 mL Intravenous Q12H Dahal, Binaya, MD   3 mL at 11/17/19 2200   sorbitol 70 % solution 30 mL  30 mL Oral Daily PRN Dahal, Melina Schools, MD       vancomycin (VANCOREADY) IVPB 1750 mg/350 mL  1,750 mg Intravenous Q24H Hessie Knows, [facility] 175 mL/hr at 11/19/19 1321 1,750 mg at 11/19/19 1321     Discharge Medications: Please see discharge summary for a list of discharge medications.  Relevant Imaging Results:  Relevant Lab Results:   Additional Information SS#[id number]  Geni Bers, RN

## 2019-11-19 NOTE — Progress Notes (Signed)
PROGRESS NOTE    Chelsea Bolton   GBT:[id number]  DOB: 13-Mar-1961  DOA: 11/17/2019     2  PCP: Wenda Low, MD  CC: right leg pain  Hospital Course: Chelsea Bolton is a 59 y.o. female with PMH significant for T2DM, severe morbid obesity with BMI 56, OSA on nightly BiPAP, GERD, chronic right lower extremity cellulitis, hypothyroidism. Patient was sent [facility] ED from rehab at [facility] with an episode of dizziness, hypotension and worsening right lower extremity cellulitis.  She has had recent hospitalization at [facility] in June 2021 for septic shock due to recurrent right lower extremity cellulitis.  She completed a 2-week course of Ancef per ID.  She again presents back with worsening erythema and pain in her right lower extremity.  She endorsed ongoing serous drainage of fluid from her legs as well, worse than her right. She was again found to be septic on admission due to right lower extremity cellulitis.  She was started on vancomycin and Zosyn.   Interval History:  No events overnight.  She is complaining of mouth pain as well as pain with swallowing.  States that she takes more Magic mouthwash typically at her SNF.  She also is complaining of ongoing pain in her right lower extremity.  Discussed that we would increase the dose of her mouth pain medication and reevaluate tomorrow.  Old records reviewed in assessment of this patient  ROS: Constitutional: negative for chills and fevers, Respiratory: negative for cough, Cardiovascular: negative for chest pain and Gastrointestinal: negative for abdominal pain  Assessment & Plan: Sepsis ([place]) - source again presumed to be RLE - MRI foot done on 7/5; no abscess or osteo - continue abx, will de-escalate as able   Type 2 diabetes mellitus with foot ulcer ([place]) - continue SSI and POC glucose   Mouth pain - continue MMW, will increase dose some - lidocaine PRN as well  Hypothyroidism -Continue  Synthroid   Antimicrobials: Zosyn 11/17/19>>present Vanc 11/17/19 >> present  DVT prophylaxis: HSQ Code Status: Full Family Communication: none Disposition Plan:  . Patient came from: SNF        . Barriers to d/c OR conditions which need to be met to effect a safe d/c: none . The current disposition plan is discharge to: SNF   Objective: Vitals:   11/18/19 1129 11/18/19 2102 11/19/19 0454 11/19/19 1609  BP: 112/64 (!) 95/50 110/60 (!) 111/57  Pulse: 77 86 85 82  Resp:  20 20 (!) 22  Temp: (!) 97.5 F (36.4 C) 99.1 F (37.3 C) 98 F (36.7 C) 99.5 F (37.5 C)  TempSrc: Axillary Oral Oral Oral  SpO2: 100% 97% 96% 99%  Weight:      Height:        Intake/Output Summary (Last 24 hours) at 11/19/2019 1804 Last data filed at 11/19/2019 1455 Gross per 24 hour  Intake 4228.71 ml  Output 2200 ml  Net 2028.71 ml   Filed Weights   11/17/19 1329  Weight: (!) 156.5 kg    Examination: General appearance: Pleasant morbidly obese adult woman resting in bed in no distress Head: Normocephalic, without obvious abnormality Eyes: EOMI Lungs: clear to auscultation bilaterally Heart: regular rate and rhythm and S1, S2 normal Abdomen: Obese, soft, nontender, nondistended, bowel sounds present Extremities: Bilateral lower extremity chronic edema.  Right lower extremity is erythematous with calor present and significant tenderness to light palpation.  Right lower extremity is also wrapped in dressing. Skin: Chronic venous stasis  dermatitis in lower extremity Neurologic: Grossly normal   Consultants:   N/A  Procedures:   N/A  Data Reviewed: I have personally reviewed following labs and imaging studies Results for orders placed or performed during the hospital encounter of 11/17/19 (from the past 24 hour(s))  Glucose, capillary     Status: Abnormal   Collection Time: 11/18/19  8:55 PM  Result Value Ref Range   Glucose-Capillary 132 (H) 70 - 99 mg/dL  Urinalysis, Routine w reflex  microscopic     Status: Abnormal   Collection Time: 11/18/19 10:00 PM  Result Value Ref Range   Color, Urine YELLOW YELLOW   APPearance TURBID (A) CLEAR   Specific Gravity, Urine 1.013 1.005 - 1.030   pH 6.0 5.0 - 8.0   Glucose, UA NEGATIVE NEGATIVE mg/dL   Hgb urine dipstick MODERATE (A) NEGATIVE   Bilirubin Urine NEGATIVE NEGATIVE   Ketones, ur NEGATIVE NEGATIVE mg/dL   Protein, ur 100 (A) NEGATIVE mg/dL   Nitrite NEGATIVE NEGATIVE   Leukocytes,Ua LARGE (A) NEGATIVE   RBC / HPF 21-50 0 - 5 RBC/hpf   WBC, UA >50 (H) 0 - 5 WBC/hpf   Bacteria, UA NONE SEEN NONE SEEN   WBC Clumps PRESENT   MRSA PCR Screening     Status: None   Collection Time: 11/18/19 10:27 PM   Specimen: Nasopharyngeal  Result Value Ref Range   MRSA by PCR NEGATIVE NEGATIVE  Basic metabolic panel     Status: Abnormal   Collection Time: 11/19/19  3:25 AM  Result Value Ref Range   Sodium 138 135 - 145 mmol/L   Potassium 3.4 (L) 3.5 - 5.1 mmol/L   Chloride 113 (H) 98 - 111 mmol/L   CO2 15 (L) 22 - 32 mmol/L   Glucose, Bld 127 (H) 70 - 99 mg/dL   BUN 43 (H) 6 - 20 mg/dL   Creatinine, Ser 1.76 (H) 0.44 - 1.00 mg/dL   Calcium 7.4 (L) 8.9 - 10.3 mg/dL   GFR calc non Af Amer 31 (L) >60 mL/min   GFR calc Af Amer 36 (L) >60 mL/min   Anion gap 10 5 - 15  CBC WITH DIFFERENTIAL     Status: Abnormal   Collection Time: 11/19/19  3:25 AM  Result Value Ref Range   WBC 19.8 (H) 4.0 - 10.5 K/uL   RBC 2.90 (L) 3.87 - 5.11 MIL/uL   Hemoglobin 7.9 (L) 12.0 - 15.0 g/dL   HCT 26.4 (L) 36 - 46 %   MCV 91.0 80.0 - 100.0 fL   MCH 27.2 26.0 - 34.0 pg   MCHC 29.9 (L) 30.0 - 36.0 g/dL   RDW 18.0 (H) 11.5 - 15.5 %   Platelets 481 (H) 150 - 400 K/uL   nRBC 0.0 0.0 - 0.2 %   Neutrophils Relative % 80 %   Neutro Abs 16.0 (H) 1.7 - 7.7 K/uL   Lymphocytes Relative 8 %   Lymphs Abs 1.5 0.7 - 4.0 K/uL   Monocytes Relative 6 %   Monocytes Absolute 1.2 (H) 0 - 1 K/uL   Eosinophils Relative 2 %   Eosinophils Absolute 0.4 0 - 0 K/uL    Basophils Relative 1 %   Basophils Absolute 0.2 (H) 0 - 0 K/uL   Immature Granulocytes 3 %   Abs Immature Granulocytes 0.68 (H) 0.00 - 0.07 K/uL  Magnesium     Status: None   Collection Time: 11/19/19  3:25 AM  Result Value Ref Range  Magnesium 1.8 1.7 - 2.4 mg/dL  Phosphorus     Status: Abnormal   Collection Time: 11/19/19  3:25 AM  Result Value Ref Range   Phosphorus 5.0 (H) 2.5 - 4.6 mg/dL  Glucose, capillary     Status: Abnormal   Collection Time: 11/19/19  8:25 AM  Result Value Ref Range   Glucose-Capillary 131 (H) 70 - 99 mg/dL  Glucose, capillary     Status: Abnormal   Collection Time: 11/19/19 11:46 AM  Result Value Ref Range   Glucose-Capillary 139 (H) 70 - 99 mg/dL  Glucose, capillary     Status: Abnormal   Collection Time: 11/19/19  4:56 PM  Result Value Ref Range   Glucose-Capillary 169 (H) 70 - 99 mg/dL    Recent Results (from the past 240 hour(s))  Blood Culture (routine x 2)     Status: None (Preliminary result)   Collection Time: 11/17/19  2:06 PM   Specimen: BLOOD  Result Value Ref Range Status   Specimen Description   Final    BLOOD RIGHT ANTECUBITAL Performed at [facility], [place] [address], [place], [place] [address]    Special Requests   Final    BOTTLES DRAWN AEROBIC AND ANAEROBIC Blood Culture adequate volume Performed at [facility] [address], [place], [place] [address]    Culture   Final    NO GROWTH 2 DAYS Performed at [facility] [address] [place], [place] [address]    Report Status PENDING  Incomplete  Blood Culture (routine x 2)     Status: None (Preliminary result)   Collection Time: 11/17/19  2:34 PM   Specimen: BLOOD  Result Value Ref Range Status   Specimen Description   Final    BLOOD LEFT ANTECUBITAL Performed at [facility], [place] [address], [place], [place] [address]    Special Requests   Final    BOTTLES DRAWN AEROBIC AND ANAEROBIC Blood Culture adequate  volume Performed at [facility] [address] [place], [place] [address]    Culture   Final    NO GROWTH 2 DAYS Performed at [facility] [address], [place], [place] [address]    Report Status PENDING  Incomplete  MRSA PCR Screening     Status: None   Collection Time: 11/18/19 10:27 PM   Specimen: Nasopharyngeal  Result Value Ref Range Status   MRSA by PCR NEGATIVE NEGATIVE Final    Comment:        The GeneXpert MRSA Assay (FDA approved for NASAL specimens only), is one component of a comprehensive MRSA colonization surveillance program. It is not intended to diagnose MRSA infection nor to guide or monitor treatment for MRSA infections. Performed at [facility], [place] [address] [place], [place] [address]      Radiology Studies: MR FOOT RIGHT WO CONTRAST  Result Date: 11/17/2019 CLINICAL DATA:  Open foot wound along the plantar aspect medially. EXAM: MRI OF THE RIGHT FOREFOOT WITHOUT CONTRAST TECHNIQUE: Multiplanar, multisequence MR imaging of the right foot was performed. No intravenous contrast was administered. Examination is significantly limited due to the patient's inability to tolerate the full examination. No contrast was administered for this exam. COMPARISON:  Plain film from 10/14/2019 as well as a prior CT from 10/16/2019 FINDINGS: Bones/Joint/Cartilage Bony structures are well visualized. No acute fracture is seen. Changes consistent with prior healed second metatarsal fracture are seen. No bony edema to suggest  osteomyelitis is noted. Ligaments No gross ligamentous abnormality is seen. Muscles and Tendons Muscles and tendons appear within normal limits. Soft tissues Focal soft tissue wound is noted medially in the plantar region beneath the base of the first metatarsal. No focal fluid collection is identified. Subcutaneous edema is identified. IMPRESSION: Changes consistent with soft tissue wound in the medial aspect of  the foot inferiorly. No findings to suggest osteomyelitis are seen. Diffuse subcutaneous edema is noted consistent with cellulitis although no definitive abscess is seen. Extremely limited exam due to patient's inability to tolerate the full study. No contrast was administered for this exam. Electronically Signed   By: Inez Catalina M.D.   On: 11/17/2019 20:55   MR FOOT RIGHT WO CONTRAST  Final Result    DG Ankle Complete Left  Final Result    DG Chest Port 1 View  Final Result       Scheduled Meds: . buPROPion  300 mg Oral Daily  . calcium-vitamin D  1 tablet Oral BID  . Chlorhexidine Gluconate Cloth  6 each Topical Daily  . collagenase   Topical Daily  . gabapentin  600 mg Oral TID  . heparin  5,000 Units Subcutaneous Q8H  . insulin aspart  0-5 Units Subcutaneous QHS  . insulin aspart  0-9 Units Subcutaneous TID WC  . levothyroxine  100 mcg Oral QAC breakfast  . liver oil-zinc oxide   Topical TID  . pantoprazole  40 mg Oral Daily  . senna  1 tablet Oral BID  . sodium chloride flush  3 mL Intravenous Q12H   PRN Meds: hydrALAZINE, ibuprofen, lidocaine, lip balm, magic mouthwash w/lidocaine, magnesium hydroxide, ondansetron **OR** ondansetron (ZOFRAN) IV, oxyCODONE, phenol, sodium chloride flush, sorbitol Continuous Infusions: . sodium chloride 100 mL/hr at 11/19/19 1043  . piperacillin-tazobactam (ZOSYN)  IV 3.375 g (11/19/19 1318)  . vancomycin 1,750 mg (11/19/19 1321)      LOS: 2 days  Time spent: Greater than 50% of the 35 minute visit was spent in counseling/coordination of care for the patient as laid out in the A&P.   Dwyane Dee, MD [facility] 11/19/2019, 6:04 PM   Contact via secure chat.  To contact the attending provider between 7A-7P or the covering provider during after hours 7P-7A, please log into the web site [email or web address] and access using universal [place] password for that web site. If you do not have the password, please call the hospital  operator.

## 2019-11-19 NOTE — Assessment & Plan Note (Signed)
Continue Synthroid °

## 2019-11-20 DIAGNOSIS — L0390 Cellulitis, unspecified: Secondary | ICD-10-CM

## 2019-11-20 LAB — CBC WITH DIFFERENTIAL/PLATELET
Abs Immature Granulocytes: 1.6 10*3/uL — ABNORMAL HIGH (ref 0.00–0.07)
Basophils Absolute: 0.2 10*3/uL — ABNORMAL HIGH (ref 0.0–0.1)
Basophils Relative: 1 %
Eosinophils Absolute: 0.3 10*3/uL (ref 0.0–0.5)
Eosinophils Relative: 2 %
HCT: 26.1 % — ABNORMAL LOW (ref 36.0–46.0)
Hemoglobin: 7.9 g/dL — ABNORMAL LOW (ref 12.0–15.0)
Lymphocytes Relative: 10 %
Lymphs Abs: 1.6 10*3/uL (ref 0.7–4.0)
MCH: 27.2 pg (ref 26.0–34.0)
MCHC: 30.3 g/dL (ref 30.0–36.0)
MCV: 90 fL (ref 80.0–100.0)
Metamyelocytes Relative: 3 %
Monocytes Absolute: 1 10*3/uL (ref 0.1–1.0)
Monocytes Relative: 6 %
Myelocytes: 7 %
Neutro Abs: 11.6 10*3/uL — ABNORMAL HIGH (ref 1.7–7.7)
Neutrophils Relative %: 71 %
Platelets: 426 10*3/uL — ABNORMAL HIGH (ref 150–400)
RBC: 2.9 MIL/uL — ABNORMAL LOW (ref 3.87–5.11)
RDW: 17.8 % — ABNORMAL HIGH (ref 11.5–15.5)
WBC: 16.3 10*3/uL — ABNORMAL HIGH (ref 4.0–10.5)
nRBC: 0 % (ref 0.0–0.2)

## 2019-11-20 LAB — BASIC METABOLIC PANEL
Anion gap: 15 (ref 5–15)
BUN: 29 mg/dL — ABNORMAL HIGH (ref 6–20)
CO2: 11 mmol/L — ABNORMAL LOW (ref 22–32)
Calcium: 8.1 mg/dL — ABNORMAL LOW (ref 8.9–10.3)
Chloride: 115 mmol/L — ABNORMAL HIGH (ref 98–111)
Creatinine, Ser: 1.17 mg/dL — ABNORMAL HIGH (ref 0.44–1.00)
GFR calc Af Amer: 59 mL/min — ABNORMAL LOW (ref >60)
GFR calc non Af Amer: 51 mL/min — ABNORMAL LOW (ref >60)
Glucose, Bld: 110 mg/dL — ABNORMAL HIGH (ref 70–99)
Potassium: 3.4 mmol/L — ABNORMAL LOW (ref 3.5–5.1)
Sodium: 141 mmol/L (ref 135–145)

## 2019-11-20 LAB — GLUCOSE, CAPILLARY
Glucose-Capillary: 117 mg/dL — ABNORMAL HIGH (ref 70–99)
Glucose-Capillary: 123 mg/dL — ABNORMAL HIGH (ref 70–99)
Glucose-Capillary: 121 mg/dL — ABNORMAL HIGH (ref 70–99)
Glucose-Capillary: 110 mg/dL — ABNORMAL HIGH (ref 70–99)

## 2019-11-20 LAB — MAGNESIUM: Magnesium: 1.6 mg/dL — ABNORMAL LOW (ref 1.7–2.4)

## 2019-11-20 MED ORDER — MAGNESIUM OXIDE 400 (241.3 MG) MG PO TABS
800.0000 mg | ORAL_TABLET | Freq: Once | ORAL | Status: AC
  Administered 2019-11-20: 800 mg via ORAL
  Filled 2019-11-20: qty 2

## 2019-11-20 MED ORDER — SODIUM CHLORIDE 0.9 % IV SOLN
2.0000 g | INTRAVENOUS | Status: DC
  Administered 2019-11-20 – 2019-11-21 (×2): 2 g via INTRAVENOUS
  Filled 2019-11-20: qty 2
  Filled 2019-11-20: qty 20
  Filled 2019-11-20: qty 2

## 2019-11-20 NOTE — Plan of Care (Signed)

## 2019-11-20 NOTE — Assessment & Plan Note (Addendum)
-  Right lower extremity. - s/p Vanc/Zosyn on admission; de-escalated to Vanc/CTX. Now de-escalate to cipro/bactrim (see UTI also) -She will have difficulty with ongoing wound care given no wound care available until 12/25/2019 and patient declined going back to rehab or waiting in the hospital for insurance authorization and rehab placement

## 2019-11-20 NOTE — Progress Notes (Signed)
PROGRESS NOTE    Chelsea Bolton   SLP:[id number]  DOB: [birth year]  DOA: 11/17/2019     3  PCP: Wenda Low, MD  CC: right leg pain  Hospital Course: Chelsea Bolton is a 59 y.o. female with PMH significant for T2DM, severe morbid obesity with BMI 56, OSA on nightly BiPAP, GERD, chronic right lower extremity cellulitis, hypothyroidism. Patient was sent [facility] ED from rehab at [facility] with an episode of dizziness, hypotension and worsening right lower extremity cellulitis.  She has had recent hospitalization at [facility] in June 2021 for septic shock due to recurrent right lower extremity cellulitis.  She completed a 2-week course of Ancef per ID.  She again presents back with worsening erythema and pain in her right lower extremity.  She endorsed ongoing serous drainage of fluid from her legs as well, worse than her right. She was again found to be septic on admission due to right lower extremity cellulitis.  She was started on vancomycin and Zosyn. After she continued to improve she was transitioned to Vanc and Rocephin.    Interval History:  No events overnight.  Resting in bed comfortably this morning.  She looks more comfortable than yesterday.  Pain is improving in her right leg as well as the erythema.  She is still complaining of difficulty/pain swallowing however does continue to eat her meals.  Old records reviewed in assessment of this patient  ROS: Constitutional: negative for chills and fevers, Respiratory: negative for cough, Cardiovascular: negative for chest pain and Gastrointestinal: negative for abdominal pain  Assessment & Plan: Sepsis ([place]) - source again presumed to be RLE - MRI foot done on 7/5; no abscess or osteo - continue abx, will de-escalate as able  -DC Zosyn on 11/20/2019 and changed to Rocephin.  Continue vancomycin.  Likely will further de-escalate tomorrow  Type 2 diabetes mellitus with foot ulcer ([place]) - continue SSI  and POC glucose   Mouth pain - continue MMW - lidocaine PRN as well  Hypothyroidism -Continue Synthroid  Cellulitis -Right lower extremity.  See sepsis   Antimicrobials: Zosyn 11/17/19>> 11/20/2019 Vanc 11/17/19 >> present Rocephin 11/20/19 >> present  DVT prophylaxis: HSQ Code Status: Full Family Communication: none Disposition Plan:  . Patient came from: SNF        . Barriers to d/c OR conditions which need to be met to effect a safe d/c: none . The current disposition plan is discharge to: SNF   Objective: Vitals:   11/19/19 2045 11/20/19 0447 11/20/19 0600 11/20/19 1349  BP: (!) 110/50 118/65  (!) 143/68  Pulse: 79 76  82  Resp: 20 19  (!) 23  Temp: 98.5 F (36.9 C) 98.4 F (36.9 C)  98.8 F (37.1 C)  TempSrc: Oral Oral  Oral  SpO2: 97% 98%  100%  Weight:   (!) 149.4 kg   Height:        Intake/Output Summary (Last 24 hours) at 11/20/2019 1415 Last data filed at 11/20/2019 1352 Gross per 24 hour  Intake 1492.84 ml  Output 3250 ml  Net -1757.16 ml   Filed Weights   11/17/19 1329 11/20/19 0600  Weight: (!) 156.5 kg (!) 149.4 kg    Examination: General appearance: Pleasant morbidly obese adult woman resting in bed in no distress Head: Normocephalic, without obvious abnormality Eyes: EOMI Lungs: clear to auscultation bilaterally Heart: regular rate and rhythm and S1, S2 normal Abdomen: Obese, soft, nontender, nondistended, bowel sounds present Extremities: Bilateral lower extremity  chronic edema.  Right lower extremity is erythematous with calor present and tenderness to palpation which has all improved from previous exam.  Right lower extremity is also wrapped in dressing. 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 Neurologic: Grossly normal   Consultants:   N/A  Procedures:   N/A  Data Reviewed: I have personally reviewed following labs and imaging studies Results for orders placed or performed during the hospital encounter of 11/17/19  (from the past 24 hour(s))  Glucose, capillary     Status: Abnormal   Collection Time: 11/19/19  4:56 PM  Result Value Ref Range   Glucose-Capillary 169 (H) 70 - 99 mg/dL  Glucose, capillary     Status: Abnormal   Collection Time: 11/19/19  8:37 PM  Result Value Ref Range   Glucose-Capillary 119 (H) 70 - 99 mg/dL  Basic metabolic panel     Status: Abnormal   Collection Time: 11/20/19  4:19 AM  Result Value Ref Range   Sodium 141 135 - 145 mmol/L   Potassium 3.4 (L) 3.5 - 5.1 mmol/L   Chloride 115 (H) 98 - 111 mmol/L   CO2 11 (L) 22 - 32 mmol/L   Glucose, Bld 110 (H) 70 - 99 mg/dL   BUN 29 (H) 6 - 20 mg/dL   Creatinine, Ser 1.17 (H) 0.44 - 1.00 mg/dL   Calcium 8.1 (L) 8.9 - 10.3 mg/dL   GFR calc non Af Amer 51 (L) >60 mL/min   GFR calc Af Amer 59 (L) >60 mL/min   Anion gap 15 5 - 15  CBC WITH DIFFERENTIAL     Status: Abnormal   Collection Time: 11/20/19  4:19 AM  Result Value Ref Range   WBC 16.3 (H) 4.0 - 10.5 K/uL   RBC 2.90 (L) 3.87 - 5.11 MIL/uL   Hemoglobin 7.9 (L) 12.0 - 15.0 g/dL   HCT 26.1 (L) 36 - 46 %   MCV 90.0 80.0 - 100.0 fL   MCH 27.2 26.0 - 34.0 pg   MCHC 30.3 30.0 - 36.0 g/dL   RDW 17.8 (H) 11.5 - 15.5 %   Platelets 426 (H) 150 - 400 K/uL   nRBC 0.0 0.0 - 0.2 %   Neutrophils Relative % 71 %   Neutro Abs 11.6 (H) 1.7 - 7.7 K/uL   Lymphocytes Relative 10 %   Lymphs Abs 1.6 0.7 - 4.0 K/uL   Monocytes Relative 6 %   Monocytes Absolute 1.0 0 - 1 K/uL   Eosinophils Relative 2 %   Eosinophils Absolute 0.3 0 - 0 K/uL   Basophils Relative 1 %   Basophils Absolute 0.2 (H) 0 - 0 K/uL   WBC Morphology      MODERATE LEFT SHIFT (>5% METAS AND MYELOS,OCC PRO NOTED)   Metamyelocytes Relative 3 %   Myelocytes 7 %   Abs Immature Granulocytes 1.60 (H) 0.00 - 0.07 K/uL  Magnesium     Status: Abnormal   Collection Time: 11/20/19  4:19 AM  Result Value Ref Range   Magnesium 1.6 (L) 1.7 - 2.4 mg/dL  Glucose, capillary     Status: Abnormal   Collection Time: 11/20/19   8:00 AM  Result Value Ref Range   Glucose-Capillary 117 (H) 70 - 99 mg/dL  Glucose, capillary     Status: Abnormal   Collection Time: 11/20/19 11:52 AM  Result Value Ref Range   Glucose-Capillary 123 (H) 70 - 99 mg/dL    Recent Results (from the past 240 hour(s))  Blood Culture (routine x 2)     Status: None (Preliminary result)   Collection Time: 11/17/19  2:06 PM   Specimen: BLOOD  Result Value Ref Range Status   Specimen Description   Final    BLOOD RIGHT ANTECUBITAL Performed at [facility] [address] [place], [place] [address]    Special Requests   Final    BOTTLES DRAWN AEROBIC AND ANAEROBIC Blood Culture adequate volume Performed at [facility] [address], [place], [place] [address]    Culture   Final    NO GROWTH 3 DAYS Performed at [facility], [place] [address], [place], [place] [address]    Report Status PENDING  Incomplete  Blood Culture (routine x 2)     Status: None (Preliminary result)   Collection Time: 11/17/19  2:34 PM   Specimen: BLOOD  Result Value Ref Range Status   Specimen Description   Final    BLOOD LEFT ANTECUBITAL Performed at [facility], [place] [address], [place], [place] [address]    Special Requests   Final    BOTTLES DRAWN AEROBIC AND ANAEROBIC Blood Culture adequate volume Performed at [facility] [address], [place], [place] [address]    Culture   Final    NO GROWTH 3 DAYS Performed at [facility], [place] [address], [place], [place] [address]    Report Status PENDING  Incomplete  Urine culture     Status: Abnormal (Preliminary result)   Collection Time: 11/18/19 10:00 PM   Specimen: In/Out Cath Urine  Result Value Ref Range Status   Specimen Description   Final    IN/OUT CATH URINE Performed at [facility] [address] [place], [place] [address]    Special Requests   Final    NONE Performed at [facility], [place] [address] [place], [place] [address]    Culture (A)  Final    >=100,000 COLONIES/mL GRAM NEGATIVE RODS ISOLATING Performed at [facility], [place] [address], [place], [place] [address]    Report Status PENDING  Incomplete  MRSA PCR Screening     Status: None   Collection Time: 11/18/19 10:27 PM   Specimen: Nasopharyngeal  Result Value Ref Range Status   MRSA by PCR NEGATIVE NEGATIVE Final    Comment:        The GeneXpert MRSA Assay (FDA approved for NASAL specimens only), is one component of a comprehensive MRSA colonization surveillance program. It is not intended to diagnose MRSA infection nor to guide or monitor treatment for MRSA infections. Performed at [facility], [place] [address] [place], [place] [address]      Radiology Studies: No results found. MR FOOT RIGHT WO CONTRAST  Final Result    DG Ankle Complete Left  Final Result    DG Chest Port 1 View  Final Result       Scheduled Meds: . buPROPion  300 mg Oral Daily  . Chlorhexidine Gluconate Cloth  6 each Topical Daily  . collagenase   Topical Daily  . gabapentin  600 mg Oral TID  . heparin  5,000 Units Subcutaneous Q8H  . insulin aspart  0-5 Units Subcutaneous QHS  . insulin aspart  0-9 Units Subcutaneous TID WC  . levothyroxine  100 mcg Oral QAC breakfast  . liver oil-zinc oxide   Topical TID  . pantoprazole  40 mg Oral Daily  .  senna  1 tablet Oral BID  . sodium chloride flush  3 mL Intravenous Q12H   PRN Meds: hydrALAZINE, ibuprofen, lidocaine, lip balm, magic mouthwash w/lidocaine, magnesium hydroxide, ondansetron **OR** ondansetron (ZOFRAN) IV, oxyCODONE, phenol, sodium chloride flush, sorbitol Continuous Infusions: . sodium chloride 100 mL/hr at 11/20/19 0049  . cefTRIAXone (ROCEPHIN)  IV    . vancomycin 1,750 mg (11/20/19 1307)      LOS: 3 days  Time spent: Greater than 50% of the 35 minute visit was spent in counseling/coordination of care for the  patient as laid out in the A&P.   Dwyane Dee, MD [facility] 11/20/2019, 2:15 PM   Contact via secure chat.  To contact the attending provider between 7A-7P or the covering provider during after hours 7P-7A, please log into the web site [email or web address] and access using universal [place] password for that web site. If you do not have the password, please call the hospital operator.

## 2019-11-21 DIAGNOSIS — E11621 Type 2 diabetes mellitus with foot ulcer: Secondary | ICD-10-CM

## 2019-11-21 DIAGNOSIS — L97509 Non-pressure chronic ulcer of other part of unspecified foot with unspecified severity: Secondary | ICD-10-CM

## 2019-11-21 LAB — CBC WITH DIFFERENTIAL/PLATELET
Abs Immature Granulocytes: 2.1 10*3/uL — ABNORMAL HIGH (ref 0.00–0.07)
Basophils Absolute: 0.1 10*3/uL (ref 0.0–0.1)
Basophils Relative: 1 %
Eosinophils Absolute: 0.4 10*3/uL (ref 0.0–0.5)
Eosinophils Relative: 2 %
HCT: 27.3 % — ABNORMAL LOW (ref 36.0–46.0)
Hemoglobin: 8 g/dL — ABNORMAL LOW (ref 12.0–15.0)
Immature Granulocytes: 13 %
Lymphocytes Relative: 16 %
Lymphs Abs: 2.5 10*3/uL (ref 0.7–4.0)
MCH: 26.7 pg (ref 26.0–34.0)
MCHC: 29.3 g/dL — ABNORMAL LOW (ref 30.0–36.0)
MCV: 91 fL (ref 80.0–100.0)
Monocytes Absolute: 1.3 10*3/uL — ABNORMAL HIGH (ref 0.1–1.0)
Monocytes Relative: 8 %
Neutro Abs: 9.6 10*3/uL — ABNORMAL HIGH (ref 1.7–7.7)
Neutrophils Relative %: 60 %
Platelets: 458 10*3/uL — ABNORMAL HIGH (ref 150–400)
RBC: 3 MIL/uL — ABNORMAL LOW (ref 3.87–5.11)
RDW: 18.3 % — ABNORMAL HIGH (ref 11.5–15.5)
WBC: 15.9 10*3/uL — ABNORMAL HIGH (ref 4.0–10.5)
nRBC: 0.3 % — ABNORMAL HIGH (ref 0.0–0.2)

## 2019-11-21 LAB — BASIC METABOLIC PANEL
Anion gap: 9 (ref 5–15)
BUN: 17 mg/dL (ref 6–20)
CO2: 15 mmol/L — ABNORMAL LOW (ref 22–32)
Calcium: 8.2 mg/dL — ABNORMAL LOW (ref 8.9–10.3)
Chloride: 117 mmol/L — ABNORMAL HIGH (ref 98–111)
Creatinine, Ser: 0.83 mg/dL (ref 0.44–1.00)
GFR calc Af Amer: >60 mL/min (ref >60)
GFR calc non Af Amer: >60 mL/min (ref >60)
Glucose, Bld: 105 mg/dL — ABNORMAL HIGH (ref 70–99)
Potassium: 3 mmol/L — ABNORMAL LOW (ref 3.5–5.1)
Sodium: 141 mmol/L (ref 135–145)

## 2019-11-21 LAB — GLUCOSE, CAPILLARY
Glucose-Capillary: 101 mg/dL — ABNORMAL HIGH (ref 70–99)
Glucose-Capillary: 113 mg/dL — ABNORMAL HIGH (ref 70–99)
Glucose-Capillary: 103 mg/dL — ABNORMAL HIGH (ref 70–99)
Glucose-Capillary: 127 mg/dL — ABNORMAL HIGH (ref 70–99)

## 2019-11-21 LAB — MAGNESIUM: Magnesium: 1.4 mg/dL — ABNORMAL LOW (ref 1.7–2.4)

## 2019-11-21 NOTE — Progress Notes (Signed)
PHYSICAL THERAPY  Pt sleeping soundly, did not disturb this afternoon.  Pt has been evaluated and plans to return to SNF.  Felecia Shelling  PTA Acute  Rehabilitation Services Pager      [phone] Office      [phone]

## 2019-11-21 NOTE — Progress Notes (Signed)
PROGRESS NOTE    Chelsea Bolton   GGY:[id number]  DOB: 10-Mar-1961  DOA: 11/17/2019     4  PCP: Wenda Low, MD  CC: right leg pain  Hospital Course: Chelsea Bolton is a 59 y.o. female with PMH significant for T2DM, severe morbid obesity with BMI 56, OSA on nightly BiPAP, GERD, chronic right lower extremity cellulitis, hypothyroidism. Patient was sent [facility] ED from rehab at [facility] with an episode of dizziness, hypotension and worsening right lower extremity cellulitis.  She has had recent hospitalization at [facility] in June 2021 for septic shock due to recurrent right lower extremity cellulitis.  She completed a 2-week course of Ancef per ID.  She again presents back with worsening erythema and pain in her right lower extremity.  She endorsed ongoing serous drainage of fluid from her legs as well, worse than her right. She was again found to be septic on admission due to right lower extremity cellulitis.  She was started on vancomycin and Zosyn. After she continued to improve she was transitioned to Vanc and Rocephin.   Interval History:  No events overnight. Still having mouth pain but states the lidocaine helps. Has no other concerns this am. States that her legs feel a little better than yesterday.   Old records reviewed in assessment of this patient  ROS: Constitutional: negative for chills and fevers, Respiratory: negative for cough, Cardiovascular: negative for chest pain and Gastrointestinal: negative for abdominal pain  Assessment & Plan: Sepsis ([place]) - source again presumed to be RLE - MRI foot done on 7/5; no abscess or osteo - continue abx, will de-escalate as able  -DC Zosyn on 11/20/2019 and changed to Rocephin.  Continue vancomycin.  - continue on IV, will transition to PO once able to d/c from hospital back to SNF  Type 2 diabetes mellitus with foot ulcer ([place]) - continue SSI and POC glucose   Mouth pain - continue MMW -  lidocaine PRN as well  Hypothyroidism -Continue Synthroid  Cellulitis -Right lower extremity.  See sepsis   Antimicrobials: Zosyn 11/17/19>> 11/20/2019 Vanc 11/17/19 >> present Rocephin 11/20/19 >> present  DVT prophylaxis: HSQ Code Status: Full Family Communication: none Disposition Plan:  . Patient came from: SNF        . Barriers to d/c OR conditions which need to be met to effect a safe d/c: none . The current disposition plan is discharge to: SNF   Objective: Vitals:   11/20/19 1349 11/20/19 2032 11/21/19 0552 11/21/19 1243  BP: (!) 143/68 (!) 142/70 118/72 (!) 151/88  Pulse: 82 86 77 81  Resp: (!) 23 19 18 20   Temp: 98.8 F (37.1 C) 99.7 F (37.6 C) 98.2 F (36.8 C) 97.9 F (36.6 C)  TempSrc: Oral Oral Oral Oral  SpO2: 100% 97% 97% 100%  Weight:      Height:        Intake/Output Summary (Last 24 hours) at 11/21/2019 1511 Last data filed at 11/21/2019 1200 Gross per 24 hour  Intake 2376.64 ml  Output 2650 ml  Net -273.36 ml   Filed Weights   11/17/19 1329 11/20/19 0600  Weight: (!) 156.5 kg (!) 149.4 kg    Examination: General appearance: Pleasant morbidly obese adult woman resting in bed in no distress Head: Normocephalic, without obvious abnormality Eyes: EOMI Lungs: clear to auscultation bilaterally Heart: regular rate and rhythm and S1, S2 normal Abdomen: Obese, soft, nontender, nondistended, bowel sounds present Extremities: Bilateral lower extremity chronic edema.  Right lower extremity is erythematous with calor present and tenderness to palpation which has all improved from previous exam.  Right lower extremity is also wrapped in dressing. 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 Neurologic: Grossly normal   Consultants:   N/A  Procedures:   N/A  Data Reviewed: I have personally reviewed following labs and imaging studies Results for orders placed or performed during the hospital encounter of 11/17/19 (from the past 24 hour(s))   Glucose, capillary     Status: Abnormal   Collection Time: 11/20/19  4:55 PM  Result Value Ref Range   Glucose-Capillary 121 (H) 70 - 99 mg/dL  Glucose, capillary     Status: Abnormal   Collection Time: 11/20/19  8:30 PM  Result Value Ref Range   Glucose-Capillary 110 (H) 70 - 99 mg/dL  Magnesium     Status: Abnormal   Collection Time: 11/21/19  3:35 AM  Result Value Ref Range   Magnesium 1.4 (L) 1.7 - 2.4 mg/dL  Basic metabolic panel     Status: Abnormal   Collection Time: 11/21/19  3:35 AM  Result Value Ref Range   Sodium 141 135 - 145 mmol/L   Potassium 3.0 (L) 3.5 - 5.1 mmol/L   Chloride 117 (H) 98 - 111 mmol/L   CO2 15 (L) 22 - 32 mmol/L   Glucose, Bld 105 (H) 70 - 99 mg/dL   BUN 17 6 - 20 mg/dL   Creatinine, Ser 0.83 0.44 - 1.00 mg/dL   Calcium 8.2 (L) 8.9 - 10.3 mg/dL   GFR calc non Af Amer >60 >60 mL/min   GFR calc Af Amer >60 >60 mL/min   Anion gap 9 5 - 15  CBC with Differential/Platelet     Status: Abnormal   Collection Time: 11/21/19  3:35 AM  Result Value Ref Range   WBC 15.9 (H) 4.0 - 10.5 K/uL   RBC 3.00 (L) 3.87 - 5.11 MIL/uL   Hemoglobin 8.0 (L) 12.0 - 15.0 g/dL   HCT 27.3 (L) 36 - 46 %   MCV 91.0 80.0 - 100.0 fL   MCH 26.7 26.0 - 34.0 pg   MCHC 29.3 (L) 30.0 - 36.0 g/dL   RDW 18.3 (H) 11.5 - 15.5 %   Platelets 458 (H) 150 - 400 K/uL   nRBC 0.3 (H) 0.0 - 0.2 %   Neutrophils Relative % 60 %   Neutro Abs 9.6 (H) 1.7 - 7.7 K/uL   Lymphocytes Relative 16 %   Lymphs Abs 2.5 0.7 - 4.0 K/uL   Monocytes Relative 8 %   Monocytes Absolute 1.3 (H) 0 - 1 K/uL   Eosinophils Relative 2 %   Eosinophils Absolute 0.4 0 - 0 K/uL   Basophils Relative 1 %   Basophils Absolute 0.1 0 - 0 K/uL   Immature Granulocytes 13 %   Abs Immature Granulocytes 2.10 (H) 0.00 - 0.07 K/uL  Glucose, capillary     Status: Abnormal   Collection Time: 11/21/19  7:22 AM  Result Value Ref Range   Glucose-Capillary 101 (H) 70 - 99 mg/dL  Glucose, capillary     Status: Abnormal    Collection Time: 11/21/19 12:02 PM  Result Value Ref Range   Glucose-Capillary 113 (H) 70 - 99 mg/dL    Recent Results (from the past 240 hour(s))  Blood Culture (routine x 2)     Status: None (Preliminary result)   Collection Time: 11/17/19  2:06 PM   Specimen: BLOOD  Result Value  Ref Range Status   Specimen Description   Final    BLOOD RIGHT ANTECUBITAL Performed at [facility] [address], [place], [place] [address]    Special Requests   Final    BOTTLES DRAWN AEROBIC AND ANAEROBIC Blood Culture adequate volume Performed at [facility] [address], [place], [place] [address]    Culture   Final    NO GROWTH 4 DAYS Performed at [facility], [place] [address] [place], [place] [address]    Report Status PENDING  Incomplete  Blood Culture (routine x 2)     Status: None (Preliminary result)   Collection Time: 11/17/19  2:34 PM   Specimen: BLOOD  Result Value Ref Range Status   Specimen Description   Final    BLOOD LEFT ANTECUBITAL Performed at [facility], [place] [address] [place], [place] [address]    Special Requests   Final    BOTTLES DRAWN AEROBIC AND ANAEROBIC Blood Culture adequate volume Performed at [facility] [address] [place], [place] [address]    Culture   Final    NO GROWTH 4 DAYS Performed at [facility], [place] [address] [place], [place] [address]    Report Status PENDING  Incomplete  Urine culture     Status: Abnormal (Preliminary result)   Collection Time: 11/18/19 10:00 PM   Specimen: In/Out Cath Urine  Result Value Ref Range Status   Specimen Description   Final    IN/OUT CATH URINE Performed at [facility] [address], [place], [place] [address]    Special Requests   Final    NONE Performed at [facility], [place] [address], [place], [place] [address]    Culture (A)  Final    >=100,000 COLONIES/mL PSEUDOMONAS  AERUGINOSA >=100,000 COLONIES/mL CITROBACTER BRAAKII SUSCEPTIBILITIES TO FOLLOW Performed at [facility], [place] [address] [place], [place] [address]    Report Status PENDING  Incomplete  MRSA PCR Screening     Status: None   Collection Time: 11/18/19 10:27 PM   Specimen: Nasopharyngeal  Result Value Ref Range Status   MRSA by PCR NEGATIVE NEGATIVE Final    Comment:        The GeneXpert MRSA Assay (FDA approved for NASAL specimens only), is one component of a comprehensive MRSA colonization surveillance program. It is not intended to diagnose MRSA infection nor to guide or monitor treatment for MRSA infections. Performed at [facility], [place] [address], [place], [place] [address]      Radiology Studies: No results found. MR FOOT RIGHT WO CONTRAST  Final Result    DG Ankle Complete Left  Final Result    DG Chest Port 1 View  Final Result       Scheduled Meds: . buPROPion  300 mg Oral Daily  . Chlorhexidine Gluconate Cloth  6 each Topical Daily  . collagenase   Topical Daily  . gabapentin  600 mg Oral TID  . heparin  5,000 Units Subcutaneous Q8H  . insulin aspart  0-5 Units Subcutaneous QHS  . insulin aspart  0-9 Units Subcutaneous TID WC  . levothyroxine  100 mcg Oral QAC breakfast  . liver oil-zinc oxide   Topical TID  . pantoprazole  40 mg Oral Daily  . senna  1 tablet Oral BID  . sodium chloride flush  3 mL Intravenous Q12H   PRN Meds: hydrALAZINE, ibuprofen, lidocaine,  lip balm, magic mouthwash w/lidocaine, magnesium hydroxide, ondansetron **OR** ondansetron (ZOFRAN) IV, oxyCODONE, phenol, sodium chloride flush, sorbitol Continuous Infusions: . sodium chloride 100 mL/hr at 11/21/19 0834  . cefTRIAXone (ROCEPHIN)  IV 2 g (11/20/19 1450)  . vancomycin 1,750 mg (11/21/19 1152)      LOS: 4 days  Time spent: Greater than 50% of the 35 minute visit was spent in counseling/coordination of care for the patient as laid out in the A&P.    Dwyane Dee, MD [facility] 11/21/2019, 3:11 PM   Contact via secure chat.  To contact the attending provider between 7A-7P or the covering provider during after hours 7P-7A, please log into the web site [email or web address] and access using universal [place] password for that web site. If you do not have the password, please call the hospital operator.

## 2019-11-22 DIAGNOSIS — N3000 Acute cystitis without hematuria: Secondary | ICD-10-CM

## 2019-11-22 LAB — GLUCOSE, CAPILLARY
Glucose-Capillary: 130 mg/dL — ABNORMAL HIGH (ref 70–99)
Glucose-Capillary: 124 mg/dL — ABNORMAL HIGH (ref 70–99)
Glucose-Capillary: 91 mg/dL (ref 70–99)
Glucose-Capillary: 93 mg/dL (ref 70–99)

## 2019-11-22 LAB — BASIC METABOLIC PANEL
Anion gap: 4 — ABNORMAL LOW (ref 5–15)
BUN: 12 mg/dL (ref 6–20)
CO2: 15 mmol/L — ABNORMAL LOW (ref 22–32)
Calcium: 8.1 mg/dL — ABNORMAL LOW (ref 8.9–10.3)
Chloride: 119 mmol/L — ABNORMAL HIGH (ref 98–111)
Creatinine, Ser: 0.7 mg/dL (ref 0.44–1.00)
GFR calc Af Amer: >60 mL/min (ref >60)
GFR calc non Af Amer: >60 mL/min (ref >60)
Glucose, Bld: 119 mg/dL — ABNORMAL HIGH (ref 70–99)
Potassium: 3.1 mmol/L — ABNORMAL LOW (ref 3.5–5.1)
Sodium: 138 mmol/L (ref 135–145)

## 2019-11-22 LAB — CBC WITH DIFFERENTIAL/PLATELET
Abs Immature Granulocytes: 0.5 10*3/uL — ABNORMAL HIGH (ref 0.00–0.07)
Band Neutrophils: 4 %
Basophils Absolute: 0 10*3/uL (ref 0.0–0.1)
Basophils Relative: 0 %
Eosinophils Absolute: 0.6 10*3/uL — ABNORMAL HIGH (ref 0.0–0.5)
Eosinophils Relative: 4 %
HCT: 26.2 % — ABNORMAL LOW (ref 36.0–46.0)
Hemoglobin: 7.9 g/dL — ABNORMAL LOW (ref 12.0–15.0)
Lymphocytes Relative: 12 %
Lymphs Abs: 1.9 10*3/uL (ref 0.7–4.0)
MCH: 27.3 pg (ref 26.0–34.0)
MCHC: 30.2 g/dL (ref 30.0–36.0)
MCV: 90.7 fL (ref 80.0–100.0)
Metamyelocytes Relative: 3 %
Monocytes Absolute: 1.2 10*3/uL — ABNORMAL HIGH (ref 0.1–1.0)
Monocytes Relative: 8 %
Neutro Abs: 11.4 10*3/uL — ABNORMAL HIGH (ref 1.7–7.7)
Neutrophils Relative %: 69 %
Platelets: 415 10*3/uL — ABNORMAL HIGH (ref 150–400)
RBC: 2.89 MIL/uL — ABNORMAL LOW (ref 3.87–5.11)
RDW: 18.5 % — ABNORMAL HIGH (ref 11.5–15.5)
WBC: 15.6 10*3/uL — ABNORMAL HIGH (ref 4.0–10.5)
nRBC: 0.5 % — ABNORMAL HIGH (ref 0.0–0.2)

## 2019-11-22 LAB — CULTURE, BLOOD (ROUTINE X 2)
Culture: NO GROWTH
Culture: NO GROWTH
Special Requests: ADEQUATE
Special Requests: ADEQUATE

## 2019-11-22 LAB — MAGNESIUM: Magnesium: 1.3 mg/dL — ABNORMAL LOW (ref 1.7–2.4)

## 2019-11-22 LAB — URINE CULTURE: Culture: >=100000 — AB

## 2019-11-22 MED ORDER — CIPROFLOXACIN IN D5W 400 MG/200ML IV SOLN
400.0000 mg | Freq: Two times a day (BID) | INTRAVENOUS | Status: DC
  Administered 2019-11-22: 400 mg via INTRAVENOUS
  Filled 2019-11-22: qty 200

## 2019-11-22 MED ORDER — CIPROFLOXACIN HCL 500 MG PO TABS
500.0000 mg | ORAL_TABLET | Freq: Two times a day (BID) | ORAL | Status: DC
  Administered 2019-11-22 – 2019-11-24 (×4): 500 mg via ORAL
  Filled 2019-11-22 (×4): qty 1

## 2019-11-22 MED ORDER — SULFAMETHOXAZOLE-TRIMETHOPRIM 800-160 MG PO TABS
1.0000 | ORAL_TABLET | Freq: Two times a day (BID) | ORAL | Status: DC
  Administered 2019-11-22 – 2019-11-24 (×5): 1 via ORAL
  Filled 2019-11-22 (×5): qty 1

## 2019-11-22 NOTE — TOC Progression Note (Signed)
Transition of Care [facility]) - Progression Note    Patient Details  Name: Angell Pincock MRN: [id number] Date of Birth: July 25, 1960  Transition of Care [facility]) CM/SW Contact  Ida Rogue, [place] Phone Number: 11/22/2019, 11:54 AM  Clinical Narrative:   RN called stating MD is looking for clearance to send patient back to [facility].  Jackelyn Knife at [facility] who stated authorization request was submitted to [employer] yesterday and there is no response as of yet. Information communicated with MD and RN. TOC will continue to follow during the course of hospitalization.     Expected Discharge Plan: Skilled Nursing Facility Barriers to Discharge: No Barriers Identified  Expected Discharge Plan and Services Expected Discharge Plan: Skilled Nursing Facility   Discharge Planning Services: CM Consult Post Acute Care Choice: Skilled Nursing Facility Living arrangements for the past 2 months: Skilled Nursing Facility                                       Social Determinants of Health (SDOH) Interventions    Readmission Risk Interventions No flowsheet data found.

## 2019-11-22 NOTE — Assessment & Plan Note (Signed)
-  Patient asymptomatic but given lingering leukocytosis and sepsis on admission, have elected to continue treating urine as possible source of infection -Culture grew Citrobacter and Pseudomonas -Both are sensitive to ciprofloxacin.  Transitioned antibiotics to include Cipro on 11/22/2019 and will complete a 5-day course

## 2019-11-22 NOTE — Progress Notes (Signed)
PROGRESS NOTE    Chelsea Bolton   YQI:[id number]  DOB: June 07, 1960  DOA: 11/17/2019     5  PCP: Wenda Low, MD  CC: right leg pain  Hospital Course: Chelsea Bolton is a 59 y.o. female with PMH significant for T2DM, severe morbid obesity with BMI 56, OSA on nightly BiPAP, GERD, chronic right lower extremity cellulitis, hypothyroidism. Patient was sent [facility] ED from rehab at [facility] with an episode of dizziness, hypotension and worsening right lower extremity cellulitis.  She has had recent hospitalization at [facility] in June 2021 for septic shock due to recurrent right lower extremity cellulitis.  She completed a 2-week course of Ancef per ID.  She again presents back with worsening erythema and pain in her right lower extremity.  She endorsed ongoing serous drainage of fluid from her legs as well, worse than her right. She was again found to be septic on admission due to right lower extremity cellulitis.  She was started on vancomycin and Zosyn. After she continued to improve she was transitioned to Vanc and Rocephin. Urine culture further speciated to Citrobacter and Pseudomonas, both sensitive to Cipro which was started on 7/10. She had no significant urinary sxms and despite possibility of colonization, this was elected to be treated given her clinical presentation and sepsis on admission.  Vanc was de-escalated to Bactrim after 5 days IV Vanc.    Interval History:  No events overnight. Feeling relatively okay today. Always notes her pain in her mouth and RLE but overall she seems to state it's better than when she came in. Understands the plan is for d/c back to SNF once insurance approved.   Old records reviewed in assessment of this patient  ROS: Constitutional: negative for chills and fevers, Respiratory: negative for cough, Cardiovascular: negative for chest pain and Gastrointestinal: negative for abdominal pain  Assessment & Plan: Sepsis  ([place]) - source again presumed to be RLE - MRI foot done on 7/5; no abscess or osteo - continue abx, will de-escalate as able  - see UTI and cellulitis  Cellulitis -Right lower extremity. - s/p Vanc/Zosyn on admission; de-escalated to Vanc/CTX. Now de-escalate to cipro/bactrim (see UTI also)  Acute cystitis -Patient asymptomatic but given lingering leukocytosis and sepsis on admission, have elected to continue treating urine as possible source of infection -Culture grew Citrobacter and Pseudomonas -Both are sensitive to ciprofloxacin.  Transitioned antibiotics to include Cipro on 11/22/2019 and will complete a 5-day course Type 2 diabetes mellitus with foot ulcer ([place]) - continue SSI and POC glucose   Mouth pain - continue MMW - lidocaine PRN as well  Hypothyroidism -Continue Synthroid   Antimicrobials: Zosyn 11/17/19>> 11/20/2019 Vanc 11/17/19 >> 7/10 Rocephin 11/20/19 >> 7/10 Cipro 7/10 >>present Bactrim 7/10>> present  DVT prophylaxis: HSQ Code Status: Full Family Communication: none Disposition Plan:   Patient came from: SNF         Barriers to d/c OR conditions which need to be met to effect a safe d/c: none; awaiting insurance auth  The current disposition plan is discharge to: SNF   Objective: Vitals:   11/21/19 0552 11/21/19 1243 11/21/19 2150 11/22/19 0503  BP: 118/72 (!) 151/88 (!) 150/80 139/82  Pulse: 77 81 81 76  Resp: 18 20 20 18   Temp: 98.2 F (36.8 C) 97.9 F (36.6 C) 98 F (36.7 C) 97.6 F (36.4 C)  TempSrc: Oral Oral Oral Oral  SpO2: 97% 100% 98% 100%  Weight:  Height:        Intake/Output Summary (Last 24 hours) at 11/22/2019 1303 Last data filed at 11/22/2019 0930 Gross per 24 hour  Intake 3170.7 ml  Output 1750 ml  Net 1420.7 ml   Filed Weights   11/17/19 1329 11/20/19 0600  Weight: (!) 156.5 kg (!) 149.4 kg    Examination: General appearance: Pleasant morbidly obese adult woman resting in bed in no distress Head: Normocephalic,  without obvious abnormality Eyes: EOMI Lungs: clear to auscultation bilaterally Heart: regular rate and rhythm and S1, S2 normal Abdomen: Obese, soft, nontender, nondistended, bowel sounds present Extremities: Bilateral lower extremity chronic edema.  Right lower extremity is erythematous with minimal calor now; TTP is minimal. Wounds appear improved in B/L LE as well. 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 Neurologic: Grossly normal   Consultants:   N/A  Procedures:   N/A  Data Reviewed: I have personally reviewed following labs and imaging studies Results for orders placed or performed during the hospital encounter of 11/17/19 (from the past 24 hour(s))  Glucose, capillary     Status: Abnormal   Collection Time: 11/21/19  4:29 PM  Result Value Ref Range   Glucose-Capillary 103 (H) 70 - 99 mg/dL  Glucose, capillary     Status: Abnormal   Collection Time: 11/21/19  9:45 PM  Result Value Ref Range   Glucose-Capillary 127 (H) 70 - 99 mg/dL  Magnesium     Status: Abnormal   Collection Time: 11/22/19  4:53 AM  Result Value Ref Range   Magnesium 1.3 (L) 1.7 - 2.4 mg/dL  Basic metabolic panel     Status: Abnormal   Collection Time: 11/22/19  4:53 AM  Result Value Ref Range   Sodium 138 135 - 145 mmol/L   Potassium 3.1 (L) 3.5 - 5.1 mmol/L   Chloride 119 (H) 98 - 111 mmol/L   CO2 15 (L) 22 - 32 mmol/L   Glucose, Bld 119 (H) 70 - 99 mg/dL   BUN 12 6 - 20 mg/dL   Creatinine, Ser 0.70 0.44 - 1.00 mg/dL   Calcium 8.1 (L) 8.9 - 10.3 mg/dL   GFR calc non Af Amer >60 >60 mL/min   GFR calc Af Amer >60 >60 mL/min   Anion gap 4 (L) 5 - 15  CBC with Differential/Platelet     Status: Abnormal   Collection Time: 11/22/19  4:53 AM  Result Value Ref Range   WBC 15.6 (H) 4.0 - 10.5 K/uL   RBC 2.89 (L) 3.87 - 5.11 MIL/uL   Hemoglobin 7.9 (L) 12.0 - 15.0 g/dL   HCT 26.2 (L) 36 - 46 %   MCV 90.7 80.0 - 100.0 fL   MCH 27.3 26.0 - 34.0  pg   MCHC 30.2 30.0 - 36.0 g/dL   RDW 18.5 (H) 11.5 - 15.5 %   Platelets 415 (H) 150 - 400 K/uL   nRBC 0.5 (H) 0.0 - 0.2 %   Neutrophils Relative % 69 %   Neutro Abs 11.4 (H) 1.7 - 7.7 K/uL   Band Neutrophils 4 %   Lymphocytes Relative 12 %   Lymphs Abs 1.9 0.7 - 4.0 K/uL   Monocytes Relative 8 %   Monocytes Absolute 1.2 (H) 0 - 1 K/uL   Eosinophils Relative 4 %   Eosinophils Absolute 0.6 (H) 0 - 0 K/uL   Basophils Relative 0 %   Basophils Absolute 0.0 0 - 0 K/uL   Metamyelocytes Relative  3 %   Abs Immature Granulocytes 0.50 (H) 0.00 - 0.07 K/uL  Glucose, capillary     Status: Abnormal   Collection Time: 11/22/19  9:27 AM  Result Value Ref Range   Glucose-Capillary 130 (H) 70 - 99 mg/dL    Recent Results (from the past 240 hour(s))  Blood Culture (routine x 2)     Status: None (Preliminary result)   Collection Time: 11/17/19  2:06 PM   Specimen: BLOOD  Result Value Ref Range Status   Specimen Description   Final    BLOOD RIGHT ANTECUBITAL Performed at [facility] [address] [place], [place] [address]    Special Requests   Final    BOTTLES DRAWN AEROBIC AND ANAEROBIC Blood Culture adequate volume Performed at [facility] [address], [place], [place] [address]    Culture   Final    NO GROWTH 4 DAYS Performed at [facility], [place] [address] [place], [place] [address]    Report Status PENDING  Incomplete  Blood Culture (routine x 2)     Status: None (Preliminary result)   Collection Time: 11/17/19  2:34 PM   Specimen: BLOOD  Result Value Ref Range Status   Specimen Description   Final    BLOOD LEFT ANTECUBITAL Performed at [facility], [place] [address] [place], [place] [address]    Special Requests   Final    BOTTLES DRAWN AEROBIC AND ANAEROBIC Blood Culture adequate volume Performed at [facility] [address] [place], [place] [address]    Culture   Final    NO GROWTH 4  DAYS Performed at [facility], [place] [address], [place], [place] [address]    Report Status PENDING  Incomplete  Urine culture     Status: Abnormal   Collection Time: 11/18/19 10:00 PM   Specimen: In/Out Cath Urine  Result Value Ref Range Status   Specimen Description   Final    IN/OUT CATH URINE Performed at [facility] [address], [place], [place] [address]    Special Requests   Final    NONE Performed at [facility], [place] [address], [place], [place] [address]    Culture (A)  Final    >=100,000 COLONIES/mL PSEUDOMONAS AERUGINOSA >=100,000 COLONIES/mL CITROBACTER BRAAKII    Report Status 11/22/2019 FINAL  Final   Organism ID, Bacteria PSEUDOMONAS AERUGINOSA (A)  Final   Organism ID, Bacteria CITROBACTER BRAAKII (A)  Final      Susceptibility   Citrobacter braakii - MIC*    CEFAZOLIN >=64 RESISTANT Resistant     CEFTRIAXONE <=0.25 SENSITIVE Sensitive     CIPROFLOXACIN <=0.25 SENSITIVE Sensitive     GENTAMICIN <=1 SENSITIVE Sensitive     IMIPENEM 0.5 SENSITIVE Sensitive     NITROFURANTOIN <=16 SENSITIVE Sensitive     TRIMETH/SULFA <=20 SENSITIVE Sensitive     PIP/TAZO <=4 SENSITIVE Sensitive     * >=100,000 COLONIES/mL CITROBACTER BRAAKII   Pseudomonas aeruginosa - MIC*    CEFTAZIDIME 4 SENSITIVE Sensitive     CIPROFLOXACIN 0.5 SENSITIVE Sensitive     GENTAMICIN <=1 SENSITIVE Sensitive     IMIPENEM 1 SENSITIVE Sensitive     PIP/TAZO 8 SENSITIVE Sensitive     CEFEPIME 2 SENSITIVE Sensitive     * >=100,000 COLONIES/mL PSEUDOMONAS AERUGINOSA  MRSA PCR Screening     Status: None   Collection Time: 11/18/19 10:27 PM   Specimen: Nasopharyngeal  Result Value Ref Range Status   MRSA by PCR NEGATIVE NEGATIVE Final    Comment:        The GeneXpert MRSA Assay (FDA approved for NASAL specimens only), is one component of a comprehensive MRSA colonization surveillance program. It is not intended to diagnose MRSA infection nor  to guide or monitor treatment for MRSA infections. Performed at [facility], [place] [address], [place], [place] [address]      Radiology Studies: No results found. MR FOOT RIGHT WO CONTRAST  Final Result    DG Ankle Complete Left  Final Result    DG Chest Port 1 View  Final Result       Scheduled Meds:  buPROPion  300 mg Oral Daily   Chlorhexidine Gluconate Cloth  6 each Topical Daily   ciprofloxacin  500 mg Oral BID   collagenase   Topical Daily   gabapentin  600 mg Oral TID   heparin  5,000 Units Subcutaneous Q8H   insulin aspart  0-5 Units Subcutaneous QHS   insulin aspart  0-9 Units Subcutaneous TID WC   levothyroxine  100 mcg Oral QAC breakfast   liver oil-zinc oxide   Topical TID   pantoprazole  40 mg Oral Daily   senna  1 tablet Oral BID   sodium chloride flush  3 mL Intravenous Q12H   sulfamethoxazole-trimethoprim  1 tablet Oral Q12H   PRN Meds: hydrALAZINE, ibuprofen, lidocaine, lip balm, magic mouthwash w/lidocaine, magnesium hydroxide, ondansetron **OR** [DISCONTINUED] ondansetron (ZOFRAN) IV, oxyCODONE, phenol, sodium chloride flush, sorbitol Continuous Infusions:     LOS: 5 days  Time spent: Greater than 50% of the 35 minute visit was spent in counseling/coordination of care for the patient as laid out in the A&P.   Dwyane Dee, MD [facility] 11/22/2019, 1:03 PM   Contact via secure chat.  To contact the attending provider between 7A-7P or the covering provider during after hours 7P-7A, please log into the web site [email or web address] and access using universal [place] password for that web site. If you do not have the password, please call the hospital operator.

## 2019-11-23 LAB — CBC WITH DIFFERENTIAL/PLATELET
Abs Immature Granulocytes: 0 10*3/uL (ref 0.00–0.07)
Band Neutrophils: 6 %
Basophils Absolute: 0.3 10*3/uL — ABNORMAL HIGH (ref 0.0–0.1)
Basophils Relative: 2 %
Eosinophils Absolute: 0.9 10*3/uL — ABNORMAL HIGH (ref 0.0–0.5)
Eosinophils Relative: 6 %
HCT: 26.6 % — ABNORMAL LOW (ref 36.0–46.0)
Hemoglobin: 7.9 g/dL — ABNORMAL LOW (ref 12.0–15.0)
Lymphocytes Relative: 16 %
Lymphs Abs: 2.5 10*3/uL (ref 0.7–4.0)
MCH: 27.1 pg (ref 26.0–34.0)
MCHC: 29.7 g/dL — ABNORMAL LOW (ref 30.0–36.0)
MCV: 91.4 fL (ref 80.0–100.0)
Monocytes Absolute: 0.3 10*3/uL (ref 0.1–1.0)
Monocytes Relative: 2 %
Neutro Abs: 11.5 10*3/uL — ABNORMAL HIGH (ref 1.7–7.7)
Neutrophils Relative %: 68 %
Platelets: 413 10*3/uL — ABNORMAL HIGH (ref 150–400)
RBC: 2.91 MIL/uL — ABNORMAL LOW (ref 3.87–5.11)
RDW: 18.6 % — ABNORMAL HIGH (ref 11.5–15.5)
WBC: 15.6 10*3/uL — ABNORMAL HIGH (ref 4.0–10.5)
nRBC: 0.6 % — ABNORMAL HIGH (ref 0.0–0.2)

## 2019-11-23 LAB — BASIC METABOLIC PANEL
Anion gap: 8 (ref 5–15)
BUN: 9 mg/dL (ref 6–20)
CO2: 15 mmol/L — ABNORMAL LOW (ref 22–32)
Calcium: 8.5 mg/dL — ABNORMAL LOW (ref 8.9–10.3)
Chloride: 118 mmol/L — ABNORMAL HIGH (ref 98–111)
Creatinine, Ser: 0.76 mg/dL (ref 0.44–1.00)
GFR calc Af Amer: >60 mL/min (ref >60)
GFR calc non Af Amer: >60 mL/min (ref >60)
Glucose, Bld: 100 mg/dL — ABNORMAL HIGH (ref 70–99)
Potassium: 3.1 mmol/L — ABNORMAL LOW (ref 3.5–5.1)
Sodium: 141 mmol/L (ref 135–145)

## 2019-11-23 LAB — GLUCOSE, CAPILLARY
Glucose-Capillary: 94 mg/dL (ref 70–99)
Glucose-Capillary: 138 mg/dL — ABNORMAL HIGH (ref 70–99)
Glucose-Capillary: 104 mg/dL — ABNORMAL HIGH (ref 70–99)
Glucose-Capillary: 115 mg/dL — ABNORMAL HIGH (ref 70–99)

## 2019-11-23 LAB — MAGNESIUM: Magnesium: 1.3 mg/dL — ABNORMAL LOW (ref 1.7–2.4)

## 2019-11-23 NOTE — Plan of Care (Signed)

## 2019-11-23 NOTE — TOC Progression Note (Addendum)
Transition of Care [facility]) - Progression Note    Patient Details  Name: Lannette Avellino MRN: [id number] Date of Birth: [birth year]  Transition of Care [facility]) CM/SW Contact  Marina Goodell Phone Number: [phone] 11/23/2019, 11:22 AM  Clinical Narrative:     CSW called [name] for insurance auth update.  Admission coordinator [name] [phone] stated it is still pending, will update CW when she gets confirmation.  Patient will need rapid COVID test for SNF placement.  Received call from [facility].  [employer] is closed today, English as a second language teacher still pending.  Attending updated patient, as per attending patient was "pissed" she couldn't leave today.   Expected Discharge Plan: Skilled Nursing Facility Barriers to Discharge: No Barriers Identified  Expected Discharge Plan and Services Expected Discharge Plan: Skilled Nursing Facility   Discharge Planning Services: CM Consult Post Acute Care Choice: Skilled Nursing Facility Living arrangements for the past 2 months: Skilled Nursing Facility                                       Social Determinants of Health (SDOH) Interventions    Readmission Risk Interventions No flowsheet data found.

## 2019-11-23 NOTE — Progress Notes (Signed)
PROGRESS NOTE    Cadience Bradfield   JDB:[id number]  DOB: [birth year]  DOA: 11/17/2019     6  PCP: Wenda Low, MD  CC: right leg pain  Hospital Course: Chelsea Bolton is a 59 y.o. female with PMH significant for T2DM, severe morbid obesity with BMI 56, OSA on nightly BiPAP, GERD, chronic right lower extremity cellulitis, hypothyroidism. Patient was sent [facility] ED from rehab at [facility] with an episode of dizziness, hypotension and worsening right lower extremity cellulitis.  She has had recent hospitalization at [facility] in June 2021 for septic shock due to recurrent right lower extremity cellulitis.  She completed a 2-week course of Ancef per ID.  She again presents back with worsening erythema and pain in her right lower extremity.  She endorsed ongoing serous drainage of fluid from her legs as well, worse than her right. She was again found to be septic on admission due to right lower extremity cellulitis.  She was started on vancomycin and Zosyn. After she continued to improve she was transitioned to Vanc and Rocephin. Urine culture further speciated to Citrobacter and Pseudomonas, both sensitive to Cipro which was started on 7/10. She had no significant urinary sxms and despite possibility of colonization, this was elected to be treated given her clinical presentation and sepsis on admission.  Vanc was de-escalated to Bactrim after 5 days IV Vanc.    Interval History:  No events overnight. Extremely agitated and frustrated this am. She wants to leave the hospital and is frustrated that she cannot return to her SNF due to awaiting insurance approval still. She was reassured this is being worked on and is hopeful for discharging tomorrow; if not she wishes to go home with family then.   Old records reviewed in assessment of this patient  ROS: Constitutional: negative for chills and fevers, Respiratory: negative for cough, Cardiovascular: negative for chest  pain and Gastrointestinal: negative for abdominal pain  Assessment & Plan: Sepsis ([place]) - source again presumed to be RLE - MRI foot done on 7/5; no abscess or osteo - continue abx, will de-escalate as able  - see UTI and cellulitis  Type 2 diabetes mellitus with foot ulcer ([place]) - continue SSI and POC glucose   Mouth pain - continue MMW - lidocaine PRN as well  Hypothyroidism -Continue Synthroid  Cellulitis -Right lower extremity. - s/p Vanc/Zosyn on admission; de-escalated to Vanc/CTX. Now de-escalate to cipro/bactrim (see UTI also)  Acute cystitis -Patient asymptomatic but given lingering leukocytosis and sepsis on admission, have elected to continue treating urine as possible source of infection -Culture grew Citrobacter and Pseudomonas -Both are sensitive to ciprofloxacin.  Transitioned antibiotics to include Cipro on 11/22/2019 and will complete a 5-day course   Antimicrobials: Zosyn 11/17/19>> 11/20/2019 Vanc 11/17/19 >> 7/10 Rocephin 11/20/19 >> 7/10 Cipro 7/10 >>present Bactrim 7/10>> present  DVT prophylaxis: HSQ Code Status: Full Family Communication: none Disposition Plan:   Patient came from: SNF         Barriers to d/c OR conditions which need to be met to effect a safe d/c: none; awaiting insurance auth  The current disposition plan is discharge to: SNF Status is: Inpatient  Remains inpatient appropriate because:Ongoing active pain requiring inpatient pain management and Unsafe d/c plan   Dispo: The patient is from: SNF              Anticipated d/c is to: SNF  Anticipated d/c date is: 1 day              Patient currently is not medically stable to d/c.  Objective: Vitals:   11/22/19 1512 11/22/19 2052 11/23/19 0625 11/23/19 1415  BP: 130/61 130/62 118/88 120/66  Pulse: 77 79 79 81  Resp:  16 20 18   Temp: 98.1 F (36.7 C) 98 F (36.7 C) (!) 97.5 F (36.4 C) 98 F (36.7 C)  TempSrc: Oral Oral Oral Oral  SpO2: 97% 99% 100% 98%   Weight:      Height:        Intake/Output Summary (Last 24 hours) at 11/23/2019 1530 Last data filed at 11/23/2019 0900 Gross per 24 hour  Intake 480 ml  Output 1800 ml  Net -1320 ml   Filed Weights   11/17/19 1329 11/20/19 0600  Weight: (!) 156.5 kg (!) 149.4 kg    Examination: General appearance: Pleasant morbidly obese adult woman resting in bed in no distress Head: Normocephalic, without obvious abnormality Eyes: EOMI Lungs: clear to auscultation bilaterally Heart: regular rate and rhythm and S1, S2 normal Abdomen: Obese, soft, nontender, nondistended, bowel sounds present Extremities: Bilateral lower extremity chronic edema.  Right lower extremity is erythematous with minimal calor now; TTP is minimal. Wounds appear improved in B/L LE as well. 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 Neurologic: Grossly normal   Consultants:   N/A  Procedures:   N/A  Data Reviewed: I have personally reviewed following labs and imaging studies Results for orders placed or performed during the hospital encounter of 11/17/19 (from the past 24 hour(s))  Glucose, capillary     Status: None   Collection Time: 11/22/19  5:04 PM  Result Value Ref Range   Glucose-Capillary 91 70 - 99 mg/dL  Glucose, capillary     Status: None   Collection Time: 11/22/19 10:04 PM  Result Value Ref Range   Glucose-Capillary 93 70 - 99 mg/dL  Magnesium     Status: Abnormal   Collection Time: 11/23/19  3:41 AM  Result Value Ref Range   Magnesium 1.3 (L) 1.7 - 2.4 mg/dL  Basic metabolic panel     Status: Abnormal   Collection Time: 11/23/19  3:41 AM  Result Value Ref Range   Sodium 141 135 - 145 mmol/L   Potassium 3.1 (L) 3.5 - 5.1 mmol/L   Chloride 118 (H) 98 - 111 mmol/L   CO2 15 (L) 22 - 32 mmol/L   Glucose, Bld 100 (H) 70 - 99 mg/dL   BUN 9 6 - 20 mg/dL   Creatinine, Ser 0.76 0.44 - 1.00 mg/dL   Calcium 8.5 (L) 8.9 - 10.3 mg/dL   GFR calc non  Af Amer >60 >60 mL/min   GFR calc Af Amer >60 >60 mL/min   Anion gap 8 5 - 15  CBC with Differential/Platelet     Status: Abnormal   Collection Time: 11/23/19  3:41 AM  Result Value Ref Range   WBC 15.6 (H) 4.0 - 10.5 K/uL   RBC 2.91 (L) 3.87 - 5.11 MIL/uL   Hemoglobin 7.9 (L) 12.0 - 15.0 g/dL   HCT 26.6 (L) 36 - 46 %   MCV 91.4 80.0 - 100.0 fL   MCH 27.1 26.0 - 34.0 pg   MCHC 29.7 (L) 30.0 - 36.0 g/dL   RDW 18.6 (H) 11.5 - 15.5 %   Platelets 413 (H) 150 - 400 K/uL   nRBC 0.6 (H)  0.0 - 0.2 %   Neutrophils Relative % 68 %   Neutro Abs 11.5 (H) 1.7 - 7.7 K/uL   Band Neutrophils 6 %   Lymphocytes Relative 16 %   Lymphs Abs 2.5 0.7 - 4.0 K/uL   Monocytes Relative 2 %   Monocytes Absolute 0.3 0 - 1 K/uL   Eosinophils Relative 6 %   Eosinophils Absolute 0.9 (H) 0 - 0 K/uL   Basophils Relative 2 %   Basophils Absolute 0.3 (H) 0 - 0 K/uL   Abs Immature Granulocytes 0.00 0.00 - 0.07 K/uL   Polychromasia PRESENT   Glucose, capillary     Status: None   Collection Time: 11/23/19  7:55 AM  Result Value Ref Range   Glucose-Capillary 94 70 - 99 mg/dL  Glucose, capillary     Status: Abnormal   Collection Time: 11/23/19 11:35 AM  Result Value Ref Range   Glucose-Capillary 138 (H) 70 - 99 mg/dL    Recent Results (from the past 240 hour(s))  Blood Culture (routine x 2)     Status: None   Collection Time: 11/17/19  2:06 PM   Specimen: BLOOD  Result Value Ref Range Status   Specimen Description   Final    BLOOD RIGHT ANTECUBITAL Performed at [facility], [place] [address] [place], [place] [address]    Special Requests   Final    BOTTLES DRAWN AEROBIC AND ANAEROBIC Blood Culture adequate volume Performed at [place] [address], [place], [place] [address]    Culture   Final    NO GROWTH 5 DAYS Performed at [facility], [place] [address], [place], [place] [address]    Report Status 11/22/2019 FINAL  Final  Blood Culture (routine x 2)      Status: None   Collection Time: 11/17/19  2:34 PM   Specimen: BLOOD  Result Value Ref Range Status   Specimen Description   Final    BLOOD LEFT ANTECUBITAL Performed at [facility], [place] [address], [place], [place] [address]    Special Requests   Final    BOTTLES DRAWN AEROBIC AND ANAEROBIC Blood Culture adequate volume Performed at [place] [address], [place], [place] [address]    Culture   Final    NO GROWTH 5 DAYS Performed at [facility], [place] [address] [place], [place] [address]    Report Status 11/22/2019 FINAL  Final  Urine culture     Status: Abnormal   Collection Time: 11/18/19 10:00 PM   Specimen: In/Out Cath Urine  Result Value Ref Range Status   Specimen Description   Final    IN/OUT CATH URINE Performed at [place] [address], [place], [place] [address]    Special Requests   Final    NONE Performed at [facility], [place] [address] [place], [place] [address]    Culture (A)  Final    >=100,000 COLONIES/mL PSEUDOMONAS AERUGINOSA >=100,000 COLONIES/mL CITROBACTER BRAAKII    Report Status 11/22/2019 FINAL  Final   Organism ID, Bacteria PSEUDOMONAS AERUGINOSA (A)  Final   Organism ID, Bacteria CITROBACTER BRAAKII (A)  Final      Susceptibility   Citrobacter braakii - MIC*    CEFAZOLIN >=64 RESISTANT Resistant     CEFTRIAXONE <=0.25 SENSITIVE Sensitive     CIPROFLOXACIN <=0.25 SENSITIVE Sensitive     GENTAMICIN <=1 SENSITIVE Sensitive     IMIPENEM 0.5  SENSITIVE Sensitive     NITROFURANTOIN <=16 SENSITIVE Sensitive     TRIMETH/SULFA <=20 SENSITIVE Sensitive     PIP/TAZO <=4 SENSITIVE Sensitive     * >=100,000 COLONIES/mL CITROBACTER BRAAKII   Pseudomonas aeruginosa - MIC*    CEFTAZIDIME 4 SENSITIVE Sensitive     CIPROFLOXACIN 0.5 SENSITIVE Sensitive     GENTAMICIN <=1 SENSITIVE Sensitive     IMIPENEM 1 SENSITIVE Sensitive     PIP/TAZO 8 SENSITIVE Sensitive      CEFEPIME 2 SENSITIVE Sensitive     * >=100,000 COLONIES/mL PSEUDOMONAS AERUGINOSA  MRSA PCR Screening     Status: None   Collection Time: 11/18/19 10:27 PM   Specimen: Nasopharyngeal  Result Value Ref Range Status   MRSA by PCR NEGATIVE NEGATIVE Final    Comment:        The GeneXpert MRSA Assay (FDA approved for NASAL specimens only), is one component of a comprehensive MRSA colonization surveillance program. It is not intended to diagnose MRSA infection nor to guide or monitor treatment for MRSA infections. Performed at [facility], [place] [address] [place], [place] [address]      Radiology Studies: No results found. MR FOOT RIGHT WO CONTRAST  Final Result    DG Ankle Complete Left  Final Result    DG Chest Port 1 View  Final Result       Scheduled Meds:  buPROPion  300 mg Oral Daily   Chlorhexidine Gluconate Cloth  6 each Topical Daily   ciprofloxacin  500 mg Oral BID   collagenase   Topical Daily   gabapentin  600 mg Oral TID   heparin  5,000 Units Subcutaneous Q8H   insulin aspart  0-5 Units Subcutaneous QHS   insulin aspart  0-9 Units Subcutaneous TID WC   levothyroxine  100 mcg Oral QAC breakfast   liver oil-zinc oxide   Topical TID   pantoprazole  40 mg Oral Daily   senna  1 tablet Oral BID   sodium chloride flush  3 mL Intravenous Q12H   sulfamethoxazole-trimethoprim  1 tablet Oral Q12H   PRN Meds: hydrALAZINE, ibuprofen, lidocaine, lip balm, magic mouthwash w/lidocaine, magnesium hydroxide, ondansetron **OR** [DISCONTINUED] ondansetron (ZOFRAN) IV, oxyCODONE, phenol, sodium chloride flush, sorbitol Continuous Infusions:     LOS: 6 days  Time spent: Greater than 50% of the 35 minute visit was spent in counseling/coordination of care for the patient as laid out in the A&P.   Dwyane Dee, MD [facility] 11/23/2019, 3:30 PM   Contact via secure chat.  To contact the attending provider between 7A-7P or the  covering provider during after hours 7P-7A, please log into the web site [email or web address] and access using universal [place] password for that web site. If you do not have the password, please call the hospital operator.

## 2019-11-24 ENCOUNTER — Encounter (HOSPITAL_COMMUNITY): Payer: Self-pay

## 2019-11-24 ENCOUNTER — Other Ambulatory Visit: Payer: Self-pay

## 2019-11-24 ENCOUNTER — Emergency Department (HOSPITAL_COMMUNITY)
Admission: EM | Admit: 2019-11-24 | Discharge: 2019-11-29 | Disposition: A | Discharge status: Home / Self Care | Payer: Medicaid Other | Attending: Emergency Medicine | Admitting: Emergency Medicine

## 2019-11-24 DIAGNOSIS — R5381 Other malaise: Secondary | ICD-10-CM | POA: Diagnosis not present

## 2019-11-24 DIAGNOSIS — E119 Type 2 diabetes mellitus without complications: Secondary | ICD-10-CM | POA: Insufficient documentation

## 2019-11-24 DIAGNOSIS — L03115 Cellulitis of right lower limb: Secondary | ICD-10-CM | POA: Insufficient documentation

## 2019-11-24 DIAGNOSIS — I10 Essential (primary) hypertension: Secondary | ICD-10-CM | POA: Insufficient documentation

## 2019-11-24 DIAGNOSIS — L03119 Cellulitis of unspecified part of limb: Secondary | ICD-10-CM

## 2019-11-24 DIAGNOSIS — Z794 Long term (current) use of insulin: Secondary | ICD-10-CM | POA: Insufficient documentation

## 2019-11-24 DIAGNOSIS — L03116 Cellulitis of left lower limb: Secondary | ICD-10-CM | POA: Insufficient documentation

## 2019-11-24 DIAGNOSIS — E039 Hypothyroidism, unspecified: Secondary | ICD-10-CM | POA: Insufficient documentation

## 2019-11-24 DIAGNOSIS — Z79899 Other long term (current) drug therapy: Secondary | ICD-10-CM | POA: Insufficient documentation

## 2019-11-24 LAB — CBC WITH DIFFERENTIAL/PLATELET
Abs Immature Granulocytes: 2.14 10*3/uL — ABNORMAL HIGH (ref 0.00–0.07)
Basophils Absolute: 0.2 10*3/uL — ABNORMAL HIGH (ref 0.0–0.1)
Basophils Relative: 2 %
Eosinophils Absolute: 0.4 10*3/uL (ref 0.0–0.5)
Eosinophils Relative: 3 %
HCT: 25.6 % — ABNORMAL LOW (ref 36.0–46.0)
Hemoglobin: 7.6 g/dL — ABNORMAL LOW (ref 12.0–15.0)
Immature Granulocytes: 13 %
Lymphocytes Relative: 16 %
Lymphs Abs: 2.7 10*3/uL (ref 0.7–4.0)
MCH: 27 pg (ref 26.0–34.0)
MCHC: 29.7 g/dL — ABNORMAL LOW (ref 30.0–36.0)
MCV: 91.1 fL (ref 80.0–100.0)
Monocytes Absolute: 1 10*3/uL (ref 0.1–1.0)
Monocytes Relative: 6 %
Neutro Abs: 10 10*3/uL — ABNORMAL HIGH (ref 1.7–7.7)
Neutrophils Relative %: 60 %
Platelets: 401 10*3/uL — ABNORMAL HIGH (ref 150–400)
RBC: 2.81 MIL/uL — ABNORMAL LOW (ref 3.87–5.11)
RDW: 18.7 % — ABNORMAL HIGH (ref 11.5–15.5)
WBC: 16.4 10*3/uL — ABNORMAL HIGH (ref 4.0–10.5)
nRBC: 0.4 % — ABNORMAL HIGH (ref 0.0–0.2)

## 2019-11-24 LAB — BASIC METABOLIC PANEL
Anion gap: 8 (ref 5–15)
BUN: 8 mg/dL (ref 6–20)
CO2: 16 mmol/L — ABNORMAL LOW (ref 22–32)
Calcium: 8.4 mg/dL — ABNORMAL LOW (ref 8.9–10.3)
Chloride: 115 mmol/L — ABNORMAL HIGH (ref 98–111)
Creatinine, Ser: 0.72 mg/dL (ref 0.44–1.00)
GFR calc Af Amer: >60 mL/min (ref >60)
GFR calc non Af Amer: >60 mL/min (ref >60)
Glucose, Bld: 109 mg/dL — ABNORMAL HIGH (ref 70–99)
Potassium: 3.4 mmol/L — ABNORMAL LOW (ref 3.5–5.1)
Sodium: 139 mmol/L (ref 135–145)

## 2019-11-24 LAB — MAGNESIUM: Magnesium: 1.4 mg/dL — ABNORMAL LOW (ref 1.7–2.4)

## 2019-11-24 LAB — SARS CORONAVIRUS 2 (TAT 6-24 HRS): SARS Coronavirus 2: NEGATIVE

## 2019-11-24 LAB — GLUCOSE, CAPILLARY
Glucose-Capillary: 108 mg/dL — ABNORMAL HIGH (ref 70–99)
Glucose-Capillary: 108 mg/dL — ABNORMAL HIGH (ref 70–99)

## 2019-11-24 MED ORDER — CARVEDILOL 12.5 MG PO TABS
12.5000 mg | ORAL_TABLET | Freq: Two times a day (BID) | ORAL | Status: DC
  Administered 2019-11-25 – 2019-11-29 (×9): 12.5 mg via ORAL
  Filled 2019-11-24 (×9): qty 1

## 2019-11-24 MED ORDER — ONDANSETRON 4 MG PO TBDP
4.0000 mg | ORAL_TABLET | Freq: Three times a day (TID) | ORAL | Status: DC | PRN
  Administered 2019-11-25: 4 mg via ORAL
  Filled 2019-11-24: qty 1

## 2019-11-24 MED ORDER — LOSARTAN POTASSIUM 50 MG PO TABS: 100.0000 mg | ORAL_TABLET | Freq: Every day | ORAL | Status: DC

## 2019-11-24 MED ORDER — BUPROPION HCL ER (XL) 150 MG PO TB24
300.0000 mg | ORAL_TABLET | Freq: Every day | ORAL | Status: DC
  Administered 2019-11-25 – 2019-11-29 (×6): 300 mg via ORAL
  Filled 2019-11-24 (×6): qty 2

## 2019-11-24 MED ORDER — INSULIN GLARGINE 100 UNIT/ML SOLOSTAR PEN: 25.0000 [IU] | PEN_INJECTOR | Freq: Every day | SUBCUTANEOUS | Status: DC

## 2019-11-24 MED ORDER — INSULIN ASPART 100 UNIT/ML ~~LOC~~ SOLN
2.0000 [IU] | Freq: Three times a day (TID) | SUBCUTANEOUS | Status: DC
  Filled 2019-11-24: qty 0.1

## 2019-11-24 MED ORDER — CIPROFLOXACIN HCL 500 MG PO TABS: 500.0000 mg | ORAL_TABLET | Freq: Two times a day (BID) | ORAL | 0 refills | Status: AC

## 2019-11-24 MED ORDER — PANTOPRAZOLE SODIUM 40 MG PO TBEC
40.0000 mg | DELAYED_RELEASE_TABLET | Freq: Every day | ORAL | Status: DC
  Administered 2019-11-25 – 2019-11-29 (×6): 40 mg via ORAL
  Filled 2019-11-24 (×6): qty 1

## 2019-11-24 MED ORDER — METFORMIN HCL 500 MG PO TABS
1000.0000 mg | ORAL_TABLET | Freq: Two times a day (BID) | ORAL | Status: DC
  Administered 2019-11-25 – 2019-11-29 (×6): 1000 mg via ORAL
  Filled 2019-11-24 (×7): qty 2

## 2019-11-24 MED ORDER — LEVOTHYROXINE SODIUM 100 MCG PO TABS
100.0000 ug | ORAL_TABLET | Freq: Every day | ORAL | Status: DC
  Administered 2019-11-25 – 2019-11-29 (×5): 100 ug via ORAL
  Filled 2019-11-24 (×5): qty 1

## 2019-11-24 MED ORDER — CIPROFLOXACIN HCL 500 MG PO TABS
500.0000 mg | ORAL_TABLET | Freq: Two times a day (BID) | ORAL | Status: DC
  Administered 2019-11-25 – 2019-11-29 (×10): 500 mg via ORAL
  Filled 2019-11-24 (×10): qty 1

## 2019-11-24 MED ORDER — SULFAMETHOXAZOLE-TRIMETHOPRIM 800-160 MG PO TABS
1.0000 | ORAL_TABLET | Freq: Two times a day (BID) | ORAL | Status: DC
  Administered 2019-11-25 – 2019-11-29 (×10): 1 via ORAL
  Filled 2019-11-24 (×10): qty 1

## 2019-11-24 MED ORDER — GABAPENTIN 300 MG PO CAPS
900.0000 mg | ORAL_CAPSULE | Freq: Three times a day (TID) | ORAL | Status: DC
  Administered 2019-11-25 – 2019-11-29 (×14): 900 mg via ORAL
  Filled 2019-11-24 (×14): qty 3

## 2019-11-24 MED ORDER — OXYCODONE HCL 5 MG PO TABS
5.0000 mg | ORAL_TABLET | Freq: Four times a day (QID) | ORAL | Status: DC | PRN
  Administered 2019-11-25 – 2019-11-28 (×5): 5 mg via ORAL
  Filled 2019-11-24 (×5): qty 1

## 2019-11-24 MED ORDER — SULFAMETHOXAZOLE-TRIMETHOPRIM 800-160 MG PO TABS: 1.0000 | ORAL_TABLET | Freq: Two times a day (BID) | ORAL | 0 refills | Status: AC

## 2019-11-24 MED ORDER — OXYCODONE HCL 5 MG PO TABS: 5.0000 mg | ORAL_TABLET | Freq: Four times a day (QID) | ORAL | 0 refills | Status: AC | PRN

## 2019-11-24 NOTE — ED Triage Notes (Signed)
EMS reports from home, trip and fall from standing in driveway. DCd today for sepsis admission. No obvious injuries, no LOC, Pt has no complaints and is here attempting to get assistance due to not being able to walk independently and take care of herself at home.

## 2019-11-24 NOTE — TOC Progression Note (Signed)
Transition of Care [facility]) - Progression Note    Patient Details  Name: Chelsea Bolton MRN: [id number] Date of Birth: 1960-07-05  Transition of Care [facility]) CM/SW Contact  Geni Bers, RN Phone Number: 11/24/2019, 12:03 PM  Clinical Narrative:    Spoke with pt concerning [name] and explained that facility was trying to get authorization for her to come back. Pt states that she will leave at 1:15PM to go home. There is no authorization yet.    Expected Discharge Plan: Skilled Nursing Facility Barriers to Discharge: No Barriers Identified  Expected Discharge Plan and Services Expected Discharge Plan: Skilled Nursing Facility   Discharge Planning Services: CM Consult Post Acute Care Choice: Skilled Nursing Facility Living arrangements for the past 2 months: Skilled Nursing Facility                                       Social Determinants of Health (SDOH) Interventions    Readmission Risk Interventions No flowsheet data found.

## 2019-11-24 NOTE — Plan of Care (Signed)
59 year old female with recent recurrent admissions secondary to recurrent cellulitis with history of diabetes hypertension.  Patient was just discharged from the hospital today the plan was for her to be going to a nursing home facility but patient declined and elected to go home.  Prior to discharge home health was not able to to be set up secondary to patient's insurance problems.  And had been recommended to be admitted to SNF.  Patient went home and had a fall shortly thereafter at which point she was brought back to emergency department stating that she would like to be placed to SNF.  At this point she does not meet any requirements for admission.  Social work has been consulted and working on patient's placement. I would recommend continuing home medications as prescribed during her discharge.  Please reconsult [facility] if there is any indication for hospitalization that developes during   ER stay  Lakeyia Surber 11:35 PM

## 2019-11-24 NOTE — TOC Progression Note (Signed)
Transition of Care [facility]) - Progression Note    Patient Details  Name: Paitlyn Mcclatchey MRN: [id number] Date of Birth: 1961/01/23  Transition of Care [facility]) CM/SW Contact  Geni Bers, RN Phone Number: 11/24/2019, 1:04 PM  Clinical Narrative:    Jamal Maes talking and explaing to the pt the important of her going to a SNF. Also asked if she could wait a little longer for her insurance to reply.  Pt stated no she could and could not give me a reason why. Unable to fine [facility] agencies related to insurance not being in network. Pt was made aware of Wound Care appointment at [facility] on August 12 at 10:30 AM. Pt asked for a walker however, it will take 20 mins for delivery from [employer].    Expected Discharge Plan: Skilled Nursing Facility Barriers to Discharge: No Barriers Identified  Expected Discharge Plan and Services Expected Discharge Plan: Skilled Nursing Facility   Discharge Planning Services: CM Consult Post Acute Care Choice: Skilled Nursing Facility Living arrangements for the past 2 months: Skilled Nursing Facility Expected Discharge Date: 11/24/19                                     Social Determinants of Health (SDOH) Interventions    Readmission Risk Interventions No flowsheet data found.

## 2019-11-24 NOTE — Progress Notes (Signed)
CSW received a call from [employer] pt would again like placement.  [name] at [facility] updated.  CSW will continue to follow for D/C needs.  Dorothe Pea. Arno Cullers  MSW, LCSW, LCAS, CCS Transitions of Care Clinical Social Worker Care Coordination Department Ph: [phone]

## 2019-11-24 NOTE — Progress Notes (Signed)
CSW received a call from pt's [employer] that pt was in the ED after leaving of her own accord before being placed from the medical floor today.  CSW called [name] at [facility] who was aware pt had left and stated that she had stopped the auth as a result, but can start a new one if pt is willing.  CSW updated the [employer]PD who will check with the pt now.   CSW awaiting return call from [employer].  CSW will continue to follow for D/C needs.  Dorothe Pea. Wilborn Membreno  MSW, LCSW, LCAS, CCS Transitions of Care Clinical Social Worker Care Coordination Department Ph: [phone]

## 2019-11-24 NOTE — Discharge Summary (Signed)
Physician Discharge Summary  Chelsea Bolton GLO:[id number] DOB: January 12, 1961 DOA: 11/17/2019  PCP: Georgann Housekeeper, MD  Admit date: 11/17/2019 Discharge date: 11/24/2019  Admitted From: SNF Disposition:  Home per patient request Discharging physician: Lewie Chamber, MD  Recommendations for Outpatient Follow-up:  1. Continue outpatient wound care   Patient discharged to home in Discharge Condition: fair CODE STATUS: Full Diet recommendation:  Diet Orders (From admission, onward)    Start     Ordered   11/24/19 0000  Diet - low sodium heart healthy        11/24/19 1222   11/17/19 1916  Diet heart healthy/carb modified Room service appropriate? Yes; Fluid consistency: Thin  Diet effective now       Question Answer Comment  Diet-HS Snack? Nothing   Room service appropriate? Yes   Fluid consistency: Thin      11/17/19 1915          Hospital Course: Chelsea Bolton is a 59 y.o. female with PMH significant for T2DM, severe morbid obesity with BMI 56, OSA on nightly BiPAP, GERD, chronic right lower extremity cellulitis, hypothyroidism. Patient was sent [facility] ED from rehab at [facility] with an episode of dizziness, hypotension and worsening right lower extremity cellulitis.  She has had recent hospitalization at [facility] in June 2021 for septic shock due to recurrent right lower extremity cellulitis.  She completed a 2-week course of Ancef per ID.  She again presents back with worsening erythema and pain in her right lower extremity.  She endorsed ongoing serous drainage of fluid from her legs as well, worse than her right. She was again found to be septic on admission due to right lower extremity cellulitis.  She was started on vancomycin and Zosyn. After she continued to improve she was transitioned to Vanc and Rocephin. Urine culture further speciated to Citrobacter and Pseudomonas, both sensitive to Cipro which was started on 7/10. She had no significant  urinary sxms and despite possibility of colonization, this was elected to be treated given her clinical presentation and sepsis on admission.  Vanc was de-escalated to Bactrim after 5 days IV Vanc.   She had good clinical improvement overall.  She grew anxious and impatient  to wait for insurance authorization for returning back to her SNF and therefore elected to return home.  She was offered multiple times to continue remaining and waiting in the hospital for insurance approval however declined and wished to go home instead. She was arranged for outpatient wound care as well for treatment of her lower extremity ulcers.  The earliest date was August 12 and again she was given the option to be able to remain hospitalized until discharged to SNF for ongoing care, but again declined and wished to go home.  Social work was also unable to obtain home health agencies due to not being within the patient's insurance network.   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 - s/p Vanc/Zosyn on admission; de-escalated to Vanc/CTX. Now de-escalate to cipro/bactrim (see UTI also) -She will have difficulty with ongoing wound care given no wound care available until 12/25/2019 and patient declined going  back to rehab or waiting in the hospital for insurance authorization and rehab placement  Acute cystitis -Patient asymptomatic but given lingering leukocytosis and sepsis on admission, have elected to continue treating urine as possible source of infection -Culture grew Citrobacter and Pseudomonas -Both are sensitive to ciprofloxacin.  Transitioned antibiotics to include Cipro on 11/22/2019  and will complete a 5-day course    The patient's chronic medical conditions were treated accordingly per the patient's home medication regimen except as noted.  On day of discharge, patient was felt deemed stable for discharge. Patient/family member advised to call PCP or come back to ER if needed.   Discharge Diagnoses:   Principal Diagnosis: Sepsis [facility])  Active Hospital Problems   Diagnosis Date Noted  . Cellulitis 11/20/2019    Priority: Medium  . 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 Mouth pain 11/19/2019  . Hypothyroidism 11/19/2019    Resolved Hospital Problems   Diagnosis Date Noted Date Resolved  . Sepsis (HCC) 11/17/2019 11/24/2019    Priority: High  . Acute cystitis 11/22/2019 11/24/2019    Priority: Medium    Discharge Instructions    Diet - low sodium heart healthy   Complete by: As directed    Discharge wound care:   Complete by: As directed    To be arranged prior to d/c   Increase activity slowly   Complete by: As directed      Allergies as of 11/24/2019      Reactions   Ace Inhibitors    Other reaction(s): Cough (ALLERGY/intolerance)   Amlodipine    Other reaction(s): Other (See Comments) Excessive fluid      Medication List    STOP taking these medications   heparin flush 10 UNIT/ML Soln injection   Normal Saline Flush 0.9 % Soln     TAKE these medications   buPROPion 300 MG 24 hr tablet Commonly known as: WELLBUTRIN XL Take 300 mg by mouth daily.   carvedilol 12.5 MG tablet Commonly known as: COREG Take 12.5 mg by mouth 2 (two) times daily with a meal.   Cholecalciferol 1.25 MG (50000 UT) capsule Take 50,000 Units by mouth daily.   ciprofloxacin 500 MG tablet Commonly known as: CIPRO Take 1 tablet (500 mg total) by mouth 2 (two) times daily for 5 days.   gabapentin 300 MG capsule Commonly known as: NEURONTIN Take 900 mg by mouth 3 (three) times daily.   insulin lispro 100 UNIT/ML injection Commonly  known as: HUMALOG Inject 2-10 Units into the skin See admin instructions. Tid wc and QHS  200-250=2 units 251-300= 4 units 301-350= 6 units 351-400= 8 units 400= 10units   Lantus SoloStar 100 UNIT/ML Solostar Pen Generic drug: insulin glargine Inject 25 Units into the skin daily. Prime with 2units before injection   levothyroxine 100 MCG tablet Commonly known as: SYNTHROID Take 100 mcg by mouth daily before breakfast.   loperamide 2 MG tablet Commonly known as: IMODIUM A-D Take 2-4 mg by mouth 2 (two) times daily as needed for diarrhea or loose stools. Take  initially then  after each stool   losartan 100 MG tablet Commonly known as: COZAAR Take 100 mg by mouth daily.   metFORMIN 1000 MG tablet Commonly known as: GLUCOPHAGE Take 1,000 mg by mouth 2 (two) times daily with a meal.   omeprazole 40 MG capsule Commonly known as: PRILOSEC Take 40 mg by mouth daily.   ondansetron 4 MG disintegrating tablet Commonly known as: ZOFRAN-ODT Take  4 mg by mouth every 8 (eight) hours as needed for nausea or vomiting.   oxyCODONE 5 MG immediate release tablet Commonly known as: Oxy IR/ROXICODONE Take 1 tablet (5 mg total) by mouth every 6 (six) hours as needed for moderate pain.   solifenacin 10 MG tablet Commonly known as: VESICARE Take 10 mg by mouth daily.   sulfamethoxazole-trimethoprim 800-160 MG tablet Commonly known as: BACTRIM DS Take 1 tablet by mouth every 12 (twelve) hours for 5 days.   traMADol 50 MG tablet Commonly known as: ULTRAM Take 50 mg by mouth every 6 (six) hours as needed for moderate pain.            Durable Medical Equipment  (From admission, onward)         Start     Ordered   11/24/19 1256  For home use only DME Walker rolling  Once       Comments: Bariatric HD  Question Answer Comment  Walker: With 5 Inch Wheels   Patient needs a walker to treat with the following condition Fear for personal safety      11/24/19 1257            Discharge Care Instructions  (From admission, onward)         Start     Ordered   11/24/19 0000  Discharge wound care:       Comments: To be arranged prior to d/c   11/24/19 1222          Follow-up Information    [facility]              Follow up.   Why: August 12 at 10:30 AM Contact information: [address] [place] [phone]             Allergies  Allergen Reactions  . Ace Inhibitors     Other reaction(s): Cough (ALLERGY/intolerance)  . Amlodipine     Other reaction(s): Other (See Comments) Excessive fluid    Procedures/Studies: DG Ankle Complete Left  Result Date: 11/17/2019 CLINICAL DATA:  New soft tissue wound.  Concern for osteomyelitis. EXAM: LEFT ANKLE COMPLETE - 3+ VIEW COMPARISON:  None. FINDINGS: There is no evidence of fracture, dislocation, or joint effusion. A plantar calcaneal enthesophyte is noted. Degenerative changes are seen involving the talonavicular joint and in the dorsal midfoot. There is soft tissue swelling around the ankle. No focal osseous demineralization is identified to suggest osteomyelitis. IMPRESSION: 1. No radiographic evidence of osteomyelitis. Electronically Signed   By: Romona Curls M.D.   On: 11/17/2019 15:54   MR FOOT RIGHT WO CONTRAST  Result Date: 11/17/2019 CLINICAL DATA:  Open foot wound along the plantar aspect medially. EXAM: MRI OF THE RIGHT FOREFOOT WITHOUT CONTRAST TECHNIQUE: Multiplanar, multisequence MR imaging of the right foot was performed. No intravenous contrast was administered. Examination is significantly limited due to the patient's inability to tolerate the full examination. No contrast was administered for this exam. COMPARISON:  Plain film from 10/14/2019 as well as a prior CT from 10/16/2019 FINDINGS: Bones/Joint/Cartilage Bony structures are well visualized. No acute fracture is seen. Changes consistent with prior healed second  metatarsal fracture are seen. No bony edema to suggest osteomyelitis is noted. Ligaments No gross ligamentous abnormality is seen. Muscles and Tendons Muscles and tendons appear within normal limits. Soft tissues Focal soft tissue wound is noted medially in the plantar region beneath the base of the first metatarsal.  No focal fluid collection is identified. Subcutaneous edema is identified. IMPRESSION: Changes consistent with soft tissue wound in the medial aspect of the foot inferiorly. No findings to suggest osteomyelitis are seen. Diffuse subcutaneous edema is noted consistent with cellulitis although no definitive abscess is seen. Extremely limited exam due to patient's inability to tolerate the full study. No contrast was administered for this exam. Electronically Signed   By: Alcide Clever M.D.   On: 11/17/2019 20:55   DG Chest Port 1 View  Result Date: 11/17/2019 CLINICAL DATA:  Sepsis EXAM: PORTABLE CHEST 1 VIEW COMPARISON:  10/15/2019 FINDINGS: RIGHT-sided PICC line terminates in the mid to distal superior vena cava. Cardiomediastinal contours and hilar structures are normal. Trachea is midline. Lungs are clear.  No sign of pleural effusion. Visualized skeletal structures on limited assessment without acute process. IMPRESSION: No acute cardiopulmonary disease. RIGHT-sided PICC line in place as described. Electronically Signed   By: Donzetta Kohut M.D.   On: 11/17/2019 15:05    Discharge Exam: BP 115/61 (BP Location: Left Arm)   Pulse 75   Temp 97.9 F (36.6 C)   Resp 18   Ht  (1.676 m)   Wt (!) 149.4 kg   SpO2 99%   BMI 53.16 kg/m  General appearance: Pleasant morbidly obese adult woman resting in bed in no distress Head: Normocephalic, without obvious abnormality Eyes: EOMI Lungs: clear to auscultation bilaterally Heart: regular rate and rhythm and S1, S2 normal Abdomen: Obese, soft, nontender, nondistended, bowel sounds present Extremities: Bilateral lower extremity chronic  edema.  Right lower extremity is erythematous with minimal calor now; TTP is minimal. Wounds appear improved in B/L LE as well. 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     Microbiology: Recent Results (from the past 240 hour(s))  Blood Culture (routine x 2)     Status: None   Collection Time: 11/17/19  2:06 PM   Specimen: BLOOD  Result Value Ref Range Status   Specimen Description   Final    BLOOD RIGHT ANTECUBITAL Performed at [facility], [address] [place], [place] [address]    Special Requests   Final    BOTTLES DRAWN AEROBIC AND ANAEROBIC Blood Culture adequate volume Performed at [facility], [address], [place], [place] [address]    Culture   Final    NO GROWTH 5 DAYS Performed at [facility], [address], [place], [place] [address]    Report Status 11/22/2019 FINAL  Final  Blood Culture (routine x 2)     Status: None   Collection Time: 11/17/19  2:34 PM   Specimen: BLOOD  Result Value Ref Range Status   Specimen Description   Final    BLOOD LEFT ANTECUBITAL Performed at [facility], [address], [place], [place] [address]    Special Requests   Final    BOTTLES DRAWN AEROBIC AND ANAEROBIC Blood Culture adequate volume Performed at [facility], [address], [place], [place] [address]    Culture   Final    NO GROWTH 5 DAYS Performed at [facility], [address] [place], [place] [address]    Report Status 11/22/2019 FINAL  Final  Urine culture     Status: Abnormal   Collection Time:  11/18/19 10:00 PM   Specimen: In/Out Cath Urine  Result Value Ref Range Status   Specimen Description   Final    IN/OUT CATH URINE Performed at  [facility], [address], [place], [place] [address]    Special Requests   Final    NONE Performed at [facility], [address], [place], [place] [address]    Culture (A)  Final    >=100,000 COLONIES/mL PSEUDOMONAS AERUGINOSA >=100,000 COLONIES/mL CITROBACTER BRAAKII    Report Status 11/22/2019 FINAL  Final   Organism ID, Bacteria PSEUDOMONAS AERUGINOSA (A)  Final   Organism ID, Bacteria CITROBACTER BRAAKII (A)  Final      Susceptibility   Citrobacter braakii - MIC*    CEFAZOLIN >=64 RESISTANT Resistant     CEFTRIAXONE <=0.25 SENSITIVE Sensitive     CIPROFLOXACIN <=0.25 SENSITIVE Sensitive     GENTAMICIN <=1 SENSITIVE Sensitive     IMIPENEM 0.5 SENSITIVE Sensitive     NITROFURANTOIN <=16 SENSITIVE Sensitive     TRIMETH/SULFA <=20 SENSITIVE Sensitive     PIP/TAZO <=4 SENSITIVE Sensitive     * >=100,000 COLONIES/mL CITROBACTER BRAAKII   Pseudomonas aeruginosa - MIC*    CEFTAZIDIME 4 SENSITIVE Sensitive     CIPROFLOXACIN 0.5 SENSITIVE Sensitive     GENTAMICIN <=1 SENSITIVE Sensitive     IMIPENEM 1 SENSITIVE Sensitive     PIP/TAZO 8 SENSITIVE Sensitive     CEFEPIME 2 SENSITIVE Sensitive     * >=100,000 COLONIES/mL PSEUDOMONAS AERUGINOSA  MRSA PCR Screening     Status: None   Collection Time: 11/18/19 10:27 PM   Specimen: Nasopharyngeal  Result Value Ref Range Status   MRSA by PCR NEGATIVE NEGATIVE Final    Comment:        The GeneXpert MRSA Assay (FDA approved for NASAL specimens only), is one component of a comprehensive MRSA colonization surveillance program. It is not intended to diagnose MRSA infection nor to guide or monitor treatment for MRSA infections. Performed at [facility], [address], [place], [place] [address]   SARS CORONAVIRUS 2 (TAT 6-24 HRS) Nasopharyngeal Nasopharyngeal Swab     Status: None   Collection Time: 11/23/19 11:31 AM   Specimen: Nasopharyngeal Swab  Result Value Ref Range  Status   SARS Coronavirus 2 NEGATIVE NEGATIVE Final    Comment: (NOTE) SARS-CoV-2 target nucleic acids are NOT DETECTED.  The SARS-CoV-2 RNA is generally detectable in upper and lower respiratory specimens during the acute phase of infection. Negative results do not preclude SARS-CoV-2 infection, do not rule out co-infections with other pathogens, and should not be used as the sole basis for treatment or other patient management decisions. Negative results must be combined with clinical observations, patient history, and epidemiological information. The expected result is Negative.  Fact Sheet for Patients: HairSlick.no  Fact Sheet for Healthcare Providers: quierodirigir.com  This test is not yet approved or cleared by the [place] FDA and  has been authorized for detection and/or diagnosis of SARS-CoV-2 by FDA under an Emergency Use Authorization (EUA). This EUA will remain  in effect (meaning this test can be used) for the duration of the COVID-19 declaration under Se ction 564(b)(1) of the Act, 21 U.S.C. section 360bbb-3(b)(1), unless the authorization is terminated or revoked sooner.  Performed at [facility], [address], [place], [place] [address]      Labs: BNP (last 3 results) No results for input(s): BNP in the last 8760 hours. Basic  Metabolic Panel: Recent Labs  Lab 11/18/19 0437 11/18/19 0437 11/19/19 0325 11/19/19 0325 11/20/19 0419 11/21/19 0335 11/22/19 0453 11/23/19 0341 11/24/19 0336  NA 136   < > 138   < > 141 141 138 141 139  K 3.7   < > 3.4*   < > 3.4* 3.0* 3.1* 3.1* 3.4*  CL 114*   < > 113*   < > 115* 117* 119* 118* 115*  CO2 12*   < > 15*   < > 11* 15* 15* 15* 16*  GLUCOSE 167*   < > 127*   < > 110* 105* 119* 100* 109*  BUN 48*   < > 43*   < > 29* 17 12 9 8   CREATININE 2.25*   < > 1.76*   < > 1.17* 0.83 0.70 0.76 0.72  CALCIUM 7.6*   < > 7.4*   < > 8.1* 8.2* 8.1* 8.5* 8.4*   MG 1.4*   < > 1.8   < > 1.6* 1.4* 1.3* 1.3* 1.4*  PHOS 5.0*  --  5.0*  --   --   --   --   --   --    < > = values in this interval not displayed.   Liver Function Tests: No results for input(s): AST, ALT, ALKPHOS, BILITOT, PROT, ALBUMIN in the last 168 hours. No results for input(s): LIPASE, AMYLASE in the last 168 hours. No results for input(s): AMMONIA in the last 168 hours. CBC: Recent Labs  Lab 11/20/19 0419 11/21/19 0335 11/22/19 0453 11/23/19 0341 11/24/19 0336  WBC 16.3* 15.9* 15.6* 15.6* 16.4*  NEUTROABS 11.6* 9.6* 11.4* 11.5* 10.0*  HGB 7.9* 8.0* 7.9* 7.9* 7.6*  HCT 26.1* 27.3* 26.2* 26.6* 25.6*  MCV 90.0 91.0 90.7 91.4 91.1  PLT 426* 458* 415* 413* 401*   Cardiac Enzymes: No results for input(s): CKTOTAL, CKMB, CKMBINDEX, TROPONINI in the last 168 hours. BNP: Invalid input(s): POCBNP CBG: Recent Labs  Lab 11/23/19 1135 11/23/19 1635 11/23/19 2028 11/24/19 0731 11/24/19 1151  GLUCAP 138* 104* 115* 108* 108*   D-Dimer No results for input(s): DDIMER in the last 72 hours. Hgb A1c No results for input(s): HGBA1C in the last 72 hours. Lipid Profile No results for input(s): CHOL, HDL, LDLCALC, TRIG, CHOLHDL, LDLDIRECT in the last 72 hours. Thyroid function studies No results for input(s): TSH, T4TOTAL, T3FREE, THYROIDAB in the last 72 hours.  Invalid input(s): FREET3 Anemia work up No results for input(s): VITAMINB12, FOLATE, FERRITIN, TIBC, IRON, RETICCTPCT in the last 72 hours. Urinalysis    Component Value Date/Time   COLORURINE YELLOW 11/18/2019 2200   APPEARANCEUR TURBID (A) 11/18/2019 2200   LABSPEC 1.013 11/18/2019 2200   PHURINE 6.0 11/18/2019 2200   GLUCOSEU NEGATIVE 11/18/2019 2200   HGBUR MODERATE (A) 11/18/2019 2200   BILIRUBINUR NEGATIVE 11/18/2019 2200   KETONESUR NEGATIVE 11/18/2019 2200   PROTEINUR 100 (A) 11/18/2019 2200   NITRITE NEGATIVE 11/18/2019 2200   LEUKOCYTESUR LARGE (A) 11/18/2019 2200   Sepsis Labs Invalid input(s):  PROCALCITONIN,  WBC,  LACTICIDVEN Microbiology Recent Results (from the past 240 hour(s))  Blood Culture (routine x 2)     Status: None   Collection Time: 11/17/19  2:06 PM   Specimen: BLOOD  Result Value Ref Range Status   Specimen Description   Final    BLOOD RIGHT ANTECUBITAL Performed at [facility], [address] [place], [place] [id number]    Special Requests   Final    BOTTLES  DRAWN AEROBIC AND ANAEROBIC Blood Culture adequate volume Performed at [facility], [address], [place], [place] [address]    Culture   Final    NO GROWTH 5 DAYS Performed at [facility], [address] [place], [place] [address]    Report Status 11/22/2019 FINAL  Final  Blood Culture (routine x 2)     Status: None   Collection Time: 11/17/19  2:34 PM   Specimen: BLOOD  Result Value Ref Range Status   Specimen Description   Final    BLOOD LEFT ANTECUBITAL Performed at [facility], [address], [place], [place] [address]    Special Requests   Final    BOTTLES DRAWN AEROBIC AND ANAEROBIC Blood Culture adequate volume Performed at [facility], [address] [place], [place] [address]    Culture   Final    NO GROWTH 5 DAYS Performed at [facility], [address], [place], [place] [address]    Report Status 11/22/2019 FINAL  Final  Urine culture     Status: Abnormal   Collection Time: 11/18/19 10:00 PM   Specimen: In/Out Cath Urine  Result Value Ref Range Status   Specimen Description   Final    IN/OUT CATH URINE Performed at [facility], [address], [place], [place] [address]    Special Requests   Final    NONE Performed at [facility], [address], [place], [place] [address]    Culture (A)  Final    >=100,000 COLONIES/mL PSEUDOMONAS AERUGINOSA >=100,000 COLONIES/mL CITROBACTER BRAAKII    Report Status 11/22/2019 FINAL  Final   Organism ID, Bacteria  PSEUDOMONAS AERUGINOSA (A)  Final   Organism ID, Bacteria CITROBACTER BRAAKII (A)  Final      Susceptibility   Citrobacter braakii - MIC*    CEFAZOLIN >=64 RESISTANT Resistant     CEFTRIAXONE <=0.25 SENSITIVE Sensitive     CIPROFLOXACIN <=0.25 SENSITIVE Sensitive     GENTAMICIN <=1 SENSITIVE Sensitive     IMIPENEM 0.5 SENSITIVE Sensitive     NITROFURANTOIN <=16 SENSITIVE Sensitive     TRIMETH/SULFA <=20 SENSITIVE Sensitive     PIP/TAZO <=4 SENSITIVE Sensitive     * >=100,000 COLONIES/mL CITROBACTER BRAAKII   Pseudomonas aeruginosa - MIC*    CEFTAZIDIME 4 SENSITIVE Sensitive     CIPROFLOXACIN 0.5 SENSITIVE Sensitive     GENTAMICIN <=1 SENSITIVE Sensitive     IMIPENEM 1 SENSITIVE Sensitive     PIP/TAZO 8 SENSITIVE Sensitive     CEFEPIME 2 SENSITIVE Sensitive     * >=100,000 COLONIES/mL PSEUDOMONAS AERUGINOSA  MRSA PCR Screening     Status: None   Collection Time: 11/18/19 10:27 PM   Specimen: Nasopharyngeal  Result Value Ref Range Status   MRSA by PCR NEGATIVE NEGATIVE Final    Comment:        The GeneXpert MRSA Assay (FDA approved for NASAL specimens only), is one component of a comprehensive MRSA colonization surveillance program. It is not intended to diagnose MRSA infection nor to guide or monitor treatment for MRSA infections. Performed at [facility], [address], [place], [place] [address]   SARS CORONAVIRUS 2 (TAT 6-24 HRS) Nasopharyngeal Nasopharyngeal Swab     Status: None   Collection Time: 11/23/19 11:31 AM   Specimen: Nasopharyngeal Swab  Result Value Ref Range Status   SARS Coronavirus 2 NEGATIVE NEGATIVE Final    Comment: (NOTE) SARS-CoV-2  target nucleic acids are NOT DETECTED.  The SARS-CoV-2 RNA is generally detectable in upper and lower respiratory specimens during the acute phase of infection. Negative results do not preclude SARS-CoV-2 infection, do not rule out co-infections with other pathogens, and should not be used as  the sole basis for treatment or other patient management decisions. Negative results must be combined with clinical observations, patient history, and epidemiological information. The expected result is Negative.  Fact Sheet for Patients: HairSlick.no  Fact Sheet for Healthcare Providers: quierodirigir.com  This test is not yet approved or cleared by the [place] FDA and  has been authorized for detection and/or diagnosis of SARS-CoV-2 by FDA under an Emergency Use Authorization (EUA). This EUA will remain  in effect (meaning this test can be used) for the duration of the COVID-19 declaration under Se ction 564(b)(1) of the Act, 21 U.S.C. section 360bbb-3(b)(1), unless the authorization is terminated or revoked sooner.  Performed at [facility] [facility], [address], [place], [place] [address]      Time coordinating discharge: Over 30 minutes    Lewie Chamber, MD  [facility] 11/24/2019, 4:39 PM Pager: Secure chat  If 7PM-7AM, please contact night-coverage [email or web address] Password [email or web address]

## 2019-11-24 NOTE — ED Provider Notes (Signed)
[facility]-EMERGENCY DEPT Provider Note   CSN: [id number] Arrival date & time: 11/24/19  1636     History Chief Complaint  Patient presents with  . Fall  . Seeking rehab placement    Chelsea Bolton is a 59 y.o. female presenting to the ED via EMS after hospital discharge just prior to arrival.  Patient was admitted for sepsis due to recurrent cellulitis to bilateral lower extremities.  She was awaiting insurance approval for placement at [facility], for regular wound care and rehabilitation.  Per chart review, patient became anxious and unwilling to wait for insurance approval.  She was adamant about going home instead, therefore was discharged this afternoon.  She states her brother brought her home.  She was ambulating with her walker though has significant difficulty due to her deconditioning.  She states her brother became frustrated and angry with her, began yelling at her she was more weak and needed more assistance than he realized.  She had a mechanical fall onto her bottom when she was trying to walk over a crack in the driveway. She has no injuries or pain from the fall, no head trauma.  He called EMS, she reports back to the ED requesting SNF placement.   The history is provided by the patient.       Past Medical History:  Diagnosis Date  . Acute renal failure (ARF) (HCC)   . Cellulitis   . Diabetes mellitus without complication (HCC)   . GERD (gastroesophageal reflux disease)   . Hypertension   . Hypothyroid   . Morbid obesity (HCC)   . Open wound of right foot   . Venous insufficiency     Patient Active Problem List   Diagnosis Date Noted  . Cellulitis 11/20/2019  . 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 Mouth pain 11/19/2019  . Hypothyroidism 11/19/2019    History reviewed. No pertinent surgical history.   OB History   No obstetric history on file.     History reviewed. No pertinent family  history.  Social History   Tobacco Use  . Smoking status: Never Smoker  . Smokeless tobacco: Never Used  Substance Use Topics  . Alcohol use: Not on file  . Drug use: Not on file    Home Medications Prior to Admission medications   Medication Sig Start Date End Date Taking? Authorizing Provider  buPROPion (WELLBUTRIN XL) 300 MG 24 hr tablet Take 300 mg by mouth daily.    [provider]  carvedilol (COREG) 12.5 MG tablet Take 12.5 mg by mouth 2 (two) times daily with a meal.    [provider]  Cholecalciferol 1.25 MG (50000 UT) capsule Take 50,000 Units by mouth daily.    [provider]  ciprofloxacin (CIPRO) 500 MG tablet Take 1 tablet (500 mg total) by mouth 2 (two) times daily for 5 days. 11/24/19 11/29/19  Lewie Chamber, MD  gabapentin (NEURONTIN) 300 MG capsule Take 900 mg by mouth 3 (three) times daily.    [provider]  insulin glargine (LANTUS SOLOSTAR) 100 UNIT/ML Solostar Pen Inject 25 Units into the skin daily. Prime with 2units before injection    [provider]  insulin lispro (HUMALOG) 100 UNIT/ML injection Inject 2-10 Units into the skin See admin instructions. Tid wc and QHS  200-250=2 units 251-300= 4 units 301-350= 6 units 351-400= 8 units 400= 10units    [provider]  levothyroxine (SYNTHROID) 100 MCG tablet Take 100  mcg by mouth daily before breakfast.    [provider]  loperamide (IMODIUM A-D) 2 MG tablet Take 2-4 mg by mouth 2 (two) times daily as needed for diarrhea or loose stools. Take 4mg  initially then 2mg  after each stool    [provider]  losartan (COZAAR) 100 MG tablet Take 100 mg by mouth daily.    [provider]  metFORMIN (GLUCOPHAGE) 1000 MG tablet Take 1,000 mg by mouth 2 (two) times daily with a meal.    [provider]  omeprazole (PRILOSEC) 40 MG capsule Take 40 mg by mouth daily.    [provider]  ondansetron (ZOFRAN-ODT) 4 MG  disintegrating tablet Take 4 mg by mouth every 8 (eight) hours as needed for nausea or vomiting.    [provider]  oxyCODONE (OXY IR/ROXICODONE) 5 MG immediate release tablet Take 1 tablet (5 mg total) by mouth every 6 (six) hours as needed for moderate pain. 11/24/19   , MD  solifenacin (VESICARE) 10 MG tablet Take 10 mg by mouth daily.    [provider]  sulfamethoxazole-trimethoprim (BACTRIM DS) 800-160 MG tablet Take 1 tablet by mouth every 12 (twelve) hours for 5 days. 11/24/19 11/29/19  01/25/20, MD  traMADol (ULTRAM) 50 MG tablet Take 50 mg by mouth every 6 (six) hours as needed for moderate pain.    [provider]    Allergies    Ace inhibitors and Amlodipine  Review of Systems   Review of Systems  All other systems reviewed and are negative.   Physical Exam Updated Vital Signs BP (!) 152/78   Pulse 86   Temp 98.4 F (36.9 C) (Oral)   Resp 18   Ht 5\' 6"  (1.676 m)   Wt (!) 158.8 kg   SpO2 100%   BMI 56.49 kg/m   Physical Exam Vitals and nursing note reviewed.  Constitutional:      General: She is not in acute distress.    Appearance: She is well-developed. She is obese.  HENT:     Head: Normocephalic and atraumatic.  Eyes:     Conjunctiva/sclera: Conjunctivae normal.  Cardiovascular:     Rate and Rhythm: Normal rate and regular rhythm.  Pulmonary:     Effort: Pulmonary effort is normal. No respiratory distress.  Abdominal:     Palpations: Abdomen is soft.  Musculoskeletal:     Comments: B/l lower legs with erythema, induration, warmth. Wounds to right foot, left ankle.  Skin:    General: Skin is warm.  Neurological:     Mental Status: She is alert.  Psychiatric:        Behavior: Behavior normal.     ED Results / Procedures / Treatments   Labs (all labs ordered are listed, but only abnormal results are displayed) Labs Reviewed - No data to display  EKG None  Radiology No results  found.  Procedures Procedures (including critical care time)  Medications Ordered in ED Medications  ciprofloxacin (CIPRO) tablet 500 mg (has no administration in time range)  gabapentin (NEURONTIN) capsule 900 mg (has no administration in time range)  levothyroxine (SYNTHROID) tablet 100 mcg (has no administration in time range)  losartan (COZAAR) tablet 100 mg (has no administration in time range)  metFORMIN (GLUCOPHAGE) tablet 1,000 mg (has no administration in time range)  pantoprazole (PROTONIX) EC tablet 40 mg (has no administration in time range)  ondansetron (ZOFRAN-ODT) disintegrating tablet 4 mg (has no administration in time range)  oxyCODONE (Oxy IR/ROXICODONE)  immediate release tablet 5 mg (has no administration in time range)  sulfamethoxazole-trimethoprim (BACTRIM DS) 800-160 MG per tablet 1 tablet (has no administration in time range)  insulin aspart (novoLOG) injection 2-10 Units (has no administration in time range)  insulin glargine (LANTUS) Solostar Pen 25 Units (has no administration in time range)  carvedilol (COREG) tablet 12.5 mg (has no administration in time range)  buPROPion (WELLBUTRIN XL) 24 hr tablet 300 mg (has no administration in time range)    ED Course  I have reviewed the triage vital signs and the nursing notes.  Pertinent labs & imaging results that were available during my care of the patient were reviewed by me and considered in my medical decision making (see chart for details).  Clinical Course as of Nov 23 2145  Mon Nov 24, 2019  1659 Discussed with Dr. Adela Glimpse, hospitalist. Suggest we board pt overnight with hopes of placement tomorrow.   [JR]    Clinical Course User Index [JR] Vang Kraeger, [place] N, PA-C   MDM Rules/Calculators/A&P                          Pt presenting back to the ED via EMS following hospital discharge a couple of hours PTA.  She was admitted for sepsis secondary to recurrent cellulitis, and was offered to stay in the  hospital while awaiting insurance approval for stay at [facility].  Patient however did not want to wait, and went home today. Unfortunately she was too weak to ambulate into her house.  She had a mechanical fall without injury.  Her brother called EMS to be transported back to the hospital.  I touched base with social work, unfortunately the authorization through her insurance was canceled when patient left the hospital, and will need to be restarted.  This is in process currently, and may take as long as 3 days.  Consulted with hospitalist, Dr. Adela Glimpse.  Recommends hold overnight and reassess placement with social work in the morning.  Patient will board in the ED.  Home medications ordered. Final Clinical Impression(s) / ED Diagnoses Final diagnoses:  Cellulitis of lower extremity, unspecified laterality  Physical deconditioning    Rx / DC Orders ED Discharge Orders    None       Darcy Cordner, [place] N, PA-C 11/24/19 2148    Linwood Dibbles, MD 11/25/19 1128

## 2019-11-24 NOTE — Progress Notes (Addendum)
Pt discharged from [facility] earlier today and, per the PA, was unable to enter her home and was BIB EMS and now desires placement.  [name] with [name] has reinitiated the ins auth and [facility] RN CM/CSW will call her to keep updated as to the outcome.  EDP updated.  2nd shift ED CSW will leave handoff for 1st shift ED CSW.  CSW will continue to follow for D/C needs.  Dorothe Pea. Gearldene Fiorenza  MSW, LCSW, LCAS, CCS Transitions of Care Clinical Social Worker Care Coordination Department Ph: [phone]

## 2019-11-25 LAB — CBG MONITORING, ED
Glucose-Capillary: 98 mg/dL (ref 70–99)
Glucose-Capillary: 128 mg/dL — ABNORMAL HIGH (ref 70–99)
Glucose-Capillary: 90 mg/dL (ref 70–99)

## 2019-11-25 MED ORDER — COLLAGENASE 250 UNIT/GM EX OINT
TOPICAL_OINTMENT | Freq: Every day | CUTANEOUS | Status: DC
  Administered 2019-11-29: 2 via TOPICAL
  Filled 2019-11-25: qty 30

## 2019-11-25 MED ORDER — LOSARTAN POTASSIUM 50 MG PO TABS
100.0000 mg | ORAL_TABLET | Freq: Every day | ORAL | Status: DC
  Administered 2019-11-25 – 2019-11-29 (×5): 100 mg via ORAL
  Filled 2019-11-25 (×5): qty 2

## 2019-11-25 MED ORDER — INSULIN ASPART 100 UNIT/ML ~~LOC~~ SOLN
0.0000 [IU] | Freq: Three times a day (TID) | SUBCUTANEOUS | Status: DC
  Administered 2019-11-25: 2 [IU] via SUBCUTANEOUS
  Filled 2019-11-25: qty 0.15

## 2019-11-25 MED ORDER — AQUAPHOR EX OINT
1.0000 "application " | TOPICAL_OINTMENT | Freq: Every day | CUTANEOUS | Status: DC
  Administered 2019-11-27: 1 via TOPICAL
  Filled 2019-11-25: qty 50

## 2019-11-25 NOTE — Consult Note (Signed)
WOC Nurse Consult Note: Reason for Consult: Bilateral LEs with chronic edema, right LE cellulitis, right Charcot foot deformity, right heel pressure injury (Unstageable), right foot full thickness wound, left chronic full thickness wound with eschar. Moisture associated skin damage, specifically intertriginous dermatitis (ITD) to the bilateral inguinal areas. Wound type: Pressure, trauma, moisture Pressure Injury POA: Yes  Measurements: Right heel: 3cm x 2.5cm with depth unable to be determined due to the presence of black eschar. No drainage. Right lateral LE: 5.5cm x 4.5cm with depth obscured by the presence of yellow, firmly adherent slough. No drainage. Right great toe: 2cm x 1.6cm x 0.1cm partial thickness wound with red wound bed, scant serous exudate. Appears to be a ruptured blister but patient denies a blister in this area. Right foot, plantar medial aspect: 1.5cm x 1cm with depth obscured by the presence of black eschar. No exudate. Left LE medial aspect: 5cm x 7cm with depth obscured by the presence of nonviable tissue, both black eschar and yellow fibrinous slough.  Wound bed: As noted above. Drainage (amount, consistency, odor)  Periwound: edematous, eryuthematous Dressing procedure/placement/frequency: I have provided Nursing with care of the intertriginous areas using our house moisture wicking textile, InterDry.  I will provide guidance for topical care of the right lateral LE full thickness wound, the right heel Unstageable pressure injury, the right foot full thickness wound, the right great toe partial thickness wound and the left medial LE full thickness wound using Collagenase (Santyl) to the right lateral LE and the left medial LE, and xeroform gauze to the other lesions. ABD pads will be used to pad/protect/absorb and the dressings secured with Kerlix roll gauze wrapped from just below toes to just below knee and secured with paper tape. Profore pressure redistribution heel  boots are provided bilaterally for offloading as patient cannot left her right LE off of the bed surface.  A mattress replacement is indicated for the management of microclimate and pressure injury prevention. Aquaphor is provided to moisturize the LEs.  As noted by my partner, D. Sylvie Farrier during her last assessment on 11/18/19, this patient would benefit form referral to Orthopedic MD or outpatient wound care center of her choosing for continued assessment of the charcot foot deformity and other full thickness wounds.  WOC nursing team will not follow, but will remain available to this patient, the nursing and medical teams.  Please re-consult if needed. Thanks, Ladona Mow, MSN, RN, GNP, [place]  Pager# [phone]

## 2019-11-25 NOTE — [place] FL2 (Signed)
[place] MEDICAID FL2 LEVEL OF CARE SCREENING TOOL     IDENTIFICATION  Patient Name: Chelsea Bolton Birthdate: Feb 21, 1961 Sex: female Admission Date (Current Location): 11/24/2019  [facility] and [place] Number:  Producer, television/film/video and Address:  [facility],  [address] [place] [address]      Provider Number: [id number]  Attending Physician Name and Address:  Default, Provider, MD  Relative Name and Phone Number:  Lanetta Inch, mother # [phone]    Current Level of Care: Hospital Recommended Level of Care: Skilled Nursing Facility Prior Approval Number:    Date Approved/Denied:   PASRR Number: [id number]  Discharge Plan: SNF    Current Diagnoses: Patient Active Problem List   Diagnosis Date Noted  . Cellulitis 11/20/2019  . 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 Mouth pain 11/19/2019  . Hypothyroidism 11/19/2019    Orientation RESPIRATION BLADDER Height & Weight     Self, Time, Situation  Normal Continent, Indwelling catheter Weight: (!) 158.8 kg Height:  5\' 6"  (167.6 cm)  BEHAVIORAL SYMPTOMS/MOOD NEUROLOGICAL BOWEL NUTRITION STATUS      Continent Diet (Heart Healthy)  AMBULATORY STATUS COMMUNICATION OF NEEDS Skin   Extensive Assist Verbally Other (Comment) (Bilateral LEs with chronic edema, right LE cellulitis, right Charcot foot deformity, right heel pressure injury (Unstageable), right foot full thickness wound, left chronic full thickness wound with eschar)                       Personal Care Assistance Level of Assistance  Bathing, Feeding, Dressing   Feeding assistance: Limited assistance Dressing Assistance: Limited assistance     Functional Limitations Info  Sight, Hearing, Speech Sight Info: Impaired Hearing Info: Adequate Speech Info: Adequate    SPECIAL CARE FACTORS FREQUENCY  PT (By licensed PT), OT (By licensed OT)     PT Frequency: 5 x per week OT Frequency: 5 x per week             Contractures Contractures Info: Not present    Additional Factors Info  Code Status, Allergies Code Status Info: Full Allergies Info: ace inhibitors, amlodipine           Current Medications (11/25/2019):  This is the current hospital active medication list Current Facility-Administered Medications  Medication Dose Route Frequency Provider Last Rate Last Admin  . buPROPion (WELLBUTRIN XL) 24 hr tablet 300 mg  300 mg Oral Daily Robinson, 11/27/2019 N, PA-C   300 mg at 11/25/19 11/27/19  . carvedilol (COREG) tablet 12.5 mg  12.5 mg Oral BID WC Robinson, 6546 N, PA-C   12.5 mg at 11/25/19 11/27/19  . ciprofloxacin (CIPRO) tablet 500 mg  500 mg Oral BID Robinson, 5035 N, PA-C   500 mg at 11/25/19 11/27/19  . collagenase (SANTYL) ointment   Topical Daily 4656, MD      . gabapentin (NEURONTIN) capsule 900 mg  900 mg Oral TID Robinson, Lorre Nick N, PA-C   900 mg at 11/25/19 1644  . insulin aspart (novoLOG) injection 0-15 Units  0-15 Units Subcutaneous TID WC 07-14-1984, MD   2 Units at 11/25/19 1327  . levothyroxine (SYNTHROID) tablet 100 mcg  100 mcg Oral QAC breakfast Robinson, 11/27/19 N, PA-C   100 mcg at 11/25/19 11/27/19  . losartan (COZAAR) tablet 100 mg  100 mg Oral Daily Robinson, 8127 N, PA-C   100 mg at 11/25/19 1439  . metFORMIN (GLUCOPHAGE) tablet 1,000 mg  1,000 mg Oral  BID WC Robinson, [place] N, PA-C   1,000 mg at 11/25/19 6160  . mineral oil-hydrophilic petrolatum (AQUAPHOR) ointment 1 application  1 application Topical Daily Lorre Nick, MD      . ondansetron (ZOFRAN-ODT) disintegrating tablet 4 mg  4 mg Oral Q8H PRN Robinson, [place] N, PA-C   4 mg at 11/25/19 1450  . oxyCODONE (Oxy IR/ROXICODONE) immediate release tablet 5 mg  5 mg Oral Q6H PRN Robinson, [place] N, PA-C   5 mg at 11/25/19 0319  . pantoprazole (PROTONIX) EC tablet 40 mg  40 mg Oral Daily Robinson, [place] N, PA-C   40 mg at 11/25/19 7371  . sulfamethoxazole-trimethoprim (BACTRIM DS) 800-160 MG per tablet 1  tablet  1 tablet Oral Q12H Robinson, [place] N, PA-C   1 tablet at 11/25/19 0626   Current Outpatient Medications  Medication Sig Dispense Refill  . buPROPion (WELLBUTRIN XL) 300 MG 24 hr tablet Take 300 mg by mouth daily.    . carvedilol (COREG) 12.5 MG tablet Take 12.5 mg by mouth 2 (two) times daily with a meal.    . Cholecalciferol 1.25 MG (50000 UT) capsule Take 50,000 Units by mouth daily.    . ciprofloxacin (CIPRO) 500 MG tablet Take 1 tablet (500 mg total) by mouth 2 (two) times daily for 5 days. 10 tablet 0  . gabapentin (NEURONTIN) 300 MG capsule Take 900 mg by mouth 3 (three) times daily.    . insulin glargine (LANTUS SOLOSTAR) 100 UNIT/ML Solostar Pen Inject 25 Units into the skin daily. Prime with 2units before injection    . insulin lispro (HUMALOG) 100 UNIT/ML injection Inject 2-10 Units into the skin See admin instructions. Tid wc and QHS  200-250=2 units 251-300= 4 units 301-350= 6 units 351-400= 8 units 400= 10units    . levothyroxine (SYNTHROID) 100 MCG tablet Take 100 mcg by mouth daily before breakfast.    . loperamide (IMODIUM A-D) 2 MG tablet Take 2-4 mg by mouth 2 (two) times daily as needed for diarrhea or loose stools. Take 4mg  initially then 2mg  after each stool    . losartan (COZAAR) 100 MG tablet Take 100 mg by mouth daily.    . metFORMIN (GLUCOPHAGE) 1000 MG tablet Take 1,000 mg by mouth 2 (two) times daily with a meal.    . omeprazole (PRILOSEC) 40 MG capsule Take 40 mg by mouth daily.    . ondansetron (ZOFRAN-ODT) 4 MG disintegrating tablet Take 4 mg by mouth every 8 (eight) hours as needed for nausea or vomiting.    oxyCODONE (OXY IR/ROXICODONE) 5 MG immediate release tablet Take 1 tablet (5 mg total) by mouth every 6 (six) hours as needed for moderate pain. 20 tablet 0  . solifenacin (VESICARE) 10 MG tablet Take 10 mg by mouth daily.    sulfamethoxazole-trimethoprim (BACTRIM DS) 800-160 MG tablet Take 1 tablet by mouth every 12 (twelve) hours for 5 days.  10 tablet 0  . traMADol (ULTRAM) 50 MG tablet Take 50 mg by mouth every 6 (six) hours as needed for moderate pain.       Discharge Medications: Please see discharge summary for a list of discharge medications.  Relevant Imaging Results:  Relevant Lab Results:   Additional Information SS#[id number]  Marland Kitchen, RN

## 2019-11-25 NOTE — ED Provider Notes (Signed)
Care assumed from Chelsea Bolton, please see her note for full details, but in brief Chelsea Bolton is a 59 y.o. female who was admitted to the hospital from 7/5 until today, she was waiting for insurance approval to return to [facility] for wound care and rehabilitation, but today became anxious and unwilling to stay in the hospital waiting for insurance approval, was adamant about going home and therefore was discharged from the hospital today, a few hours later she returned to the ED after a fall due to difficulty ambulating with her walker at home.  Patient fell landing on her bottom and did not have any signs of significant injury from prior care team.  Patient was discussed with Dr. Adela Glimpse with [facility], who did not feel that patient would require readmission to the hospital as she was medically stabilized and was only awaiting placement.  Social work has been consulted to reinitiate placement process, as this was stopped when patient left the hospital today.  Patient's home medications have been restarted and she will board here in the ED awaiting SNF placement for continued rehabilitation.  I went in and had long discussion with patient regarding plan to reinitiate process for placement, but that this can take time which is why she been waiting in the hospital.  Patient is frustrated, but understands and agrees to remain here awaiting further information from social worker regarding placement process in the morning.  Her home medications have been restarted and she has no medical complaints at this time.  Remains hemodynamically stable.  Patient will be boarding in the ED pending SNF placement, social work has already began this process.     [name], PA-C 11/25/19 7672    Devoria Albe, MD 11/25/19 [phone]

## 2019-11-25 NOTE — Progress Notes (Addendum)
Spoke to pt at bedside. States she wants to go back to [facility]. TOC CM referral for SNF placement, pt is in agreement to go back to [facility]. Permission given to complete FL2 and fax to [facility]. Will follow up with [facility] Admission Coordinator to start auth process. Isidoro Donning RN CCM, [facility] ED TOC CM [phone]

## 2019-11-25 NOTE — Progress Notes (Signed)
PT Cancellation Note  Patient Details Name: Chelsea Bolton MRN: [id number] DOB: [birth year]   Cancelled Treatment:    Reason Eval/Treat Not Completed: Other (comment), patient's legs are unwrapped [facility] will be in. Will return for eval after legs are wrapped as patient has a right heel sore.Rada Hay 11/25/2019, 9:59 AM Blanchard Kelch PT Acute Rehabilitation Services Pager [phone] Office [phone]

## 2019-11-25 NOTE — Evaluation (Signed)
Physical Therapy Evaluation Patient Details Name: Chelsea Bolton MRN: [id number] DOB: [birth year] Today's Date: 11/25/2019   History of Present Illness  Chelsea Bolton is a 59 y.o. femalewith PMH of OSA, DM2, severe morbid obesity, chronic LE wounds and cellulitis,presents to ED 7/12 after being DC'd to home as patient was adamant and did not want to wait for SNF insurance approval. patient fell trying to get to door from car.  Clinical Impression  The patient gave forth effort to mobilize to sitting on bed edge x 15", able to weight shift. Most difficulty in rolling and returning to supine. Patient reports right heel is painful. Currently, patient unable to safely attempt standing. Patient was able to transfer last week when in patient. Pt admitted with above diagnosis.  Pt currently with functional limitations due to the deficits listed below (see PT Problem List). Pt will benefit from skilled PT to increase their independence and safety with mobility to allow discharge to the venue listed below.       Follow Up Recommendations SNF    Equipment Recommendations  None recommended by PT    Recommendations for Other Services       Precautions / Restrictions Precautions Precautions: Fall Precaution Comments: R heel sore, painful      Mobility  Bed Mobility Overal bed mobility: Needs Assistance Bed Mobility: Rolling;Supine to Sit;Sit to Supine Rolling: +2 for physical assistance;+2 for safety/equipment;Total assist   Supine to sit: Max assist;HOB elevated Sit to supine: +2 for safety/equipment;+2 for physical assistance;Total assist   General bed mobility comments: patient requiring extensive assistance to move legs to get to sitting position and scoot to bed edge with multiple weight shifts. Patient reuired 2 total assist for legs and trunk  to return to supine after weight shifting to each side.  Transfers                 General transfer comment: NT  Ambulation/Gait                 Stairs            Wheelchair Mobility    Modified Rankin (Stroke Patients Only)       Balance Overall balance assessment: Needs assistance Sitting-balance support: No upper extremity supported;Feet supported Sitting balance-Leahy Scale: Fair Sitting balance - Comments: able to lean to each elbow and sit upright                                     Pertinent Vitals/Pain Faces Pain Scale: Hurts whole lot Pain Location: right heel. Pain Descriptors / Indicators: Burning;Grimacing;Moaning Pain Intervention(s): Monitored during session;Repositioned;Limited activity within patient's tolerance    Home Living Family/patient expects to be discharged to:: Skilled nursing facility                 Additional Comments: returning to SNF    Prior Function Level of Independence: Independent with assistive device(s)         Comments: prior to  Rehab     Hand Dominance        Extremity/Trunk Assessment   Upper Extremity Assessment Upper Extremity Assessment: Generalized weakness    Lower Extremity Assessment RLE Deficits / Details: difficulty lifting leg from bed, LLE Deficits / Details: requires assistance to move leg across bed.    Cervical / Trunk Assessment Cervical / Trunk Assessment: Normal  Communication      Cognition  Behavior During Therapy: Anxious Overall Cognitive Status: Within Functional Limits for tasks assessed                                        General Comments      Exercises     Assessment/Plan    PT Assessment Patient needs continued PT services  PT Problem List Decreased strength;Decreased knowledge of precautions;Decreased mobility;Obesity;Decreased activity tolerance;Decreased safety awareness;Decreased skin integrity;Pain       PT Treatment Interventions DME instruction;Therapeutic activities;Gait training;Therapeutic exercise;Patient/family education;Functional mobility  training    PT Goals (Current goals can be found in the Care Plan section)  Acute Rehab PT Goals Patient Stated Goal: go to rehab then home PT Goal Formulation: With patient Time For Goal Achievement: 12/09/19 Potential to Achieve Goals: Fair    Frequency Min 2X/week   Barriers to discharge Decreased caregiver support      Co-evaluation               AM-PAC PT "6 Clicks" Mobility  Outcome Measure Help needed turning from your back to your side while in a flat bed without using bedrails?: A Lot Help needed moving from lying on your back to sitting on the side of a flat bed without using bedrails?: A Lot Help needed moving to and from a bed to a chair (including a wheelchair)?: Total Help needed standing up from a chair using your arms (e.g., wheelchair or bedside chair)?: Total Help needed to walk in hospital room?: Total Help needed climbing 3-5 steps with a railing? : Total 6 Click Score: 8    End of Session   Activity Tolerance: Patient limited by pain;Patient limited by fatigue Patient left: in bed;with call bell/phone within reach;with nursing/sitter in room Nurse Communication: Mobility status PT Visit Diagnosis: Unsteadiness on feet (R26.81);Difficulty in walking, not elsewhere classified (R26.2);Pain Pain - Right/Left: Right Pain - part of body: Leg;Ankle and joints of foot    Time: 1345-1440 PT Time Calculation (min) (ACUTE ONLY): 55 min   Charges:   PT Evaluation $PT Eval Moderate Complexity: 1 Mod          Blanchard Kelch PT Acute Rehabilitation Services Pager [phone] Office [phone]   Rada Hay 11/25/2019, 2:53 PM

## 2019-11-25 NOTE — ED Provider Notes (Signed)
Emergency Medicine Observation Re-evaluation Note  Chelsea Bolton is a 59 y.o. female, seen on rounds today.  Pt initially presented to the ED for complaints of Fall and Seeking rehab placement Currently, the patient is resting in no acute distress.  She does have bilateral wounds on the lower extremities and redness that has been present for the last 2 weeks in the left lower extremity but nothing new today.  She was discharged from the hospital yesterday after being unable to find placement but was not even able to make it in her house when she fell and almost lost consciousness.Marland Kitchen  Physical Exam  BP (!) 164/81   Pulse 86   Temp 98.4 F (36.9 C) (Oral)   Resp 18   Ht 5\' 6"  (1.676 m)   Wt (!) 158.8 kg   SpO2 100%   BMI 56.49 kg/m  Physical Exam Obese female in no acute distress, bilateral lower extremity edema, lymphedema.  Wounds present on bilateral lower extremities.  Left leg with warmth and erythema sparing the foot and knee and then the tib-fib area.  Wound over the medial malleolus area with no significant drainage.  Right lower extremity with skin changes consistent with chronic venous stasis and lymphedema with peeling of the skin and mild warmth.  Small wound present on the plantar surface of the right foot without surrounding erythema or drainage.  Chronic appearing wound on the lateral portion of the mid right tib-fib.   ED Course / MDM  EKG:  Clinical Course as of Nov 25 855  Mon Nov 24, 2019  1659 Discussed with Dr. [id number], hospitalist. Suggest we board pt overnight with hopes of placement tomorrow.   [JR]    Clinical Course User Index [JR] Robinson, Adela Glimpse N, PA-C   I have reviewed the labs performed to date as well as medications administered while in observation.  Recent changes in the last 24 hours include no significant changes. Plan  Current plan is for patient to be evaluated by case management and social work for SNF placement for rehab.  Patient's insulin was  changed to sliding scale, wound consult initiated and PT eval initiated.Chelsea Bolton    Marland Kitchen, MD 11/25/19 [phone]

## 2019-11-26 LAB — CBG MONITORING, ED
Glucose-Capillary: 96 mg/dL (ref 70–99)
Glucose-Capillary: 118 mg/dL — ABNORMAL HIGH (ref 70–99)
Glucose-Capillary: 113 mg/dL — ABNORMAL HIGH (ref 70–99)

## 2019-11-26 MED ORDER — LOPERAMIDE HCL 2 MG PO CAPS
2.0000 mg | ORAL_CAPSULE | ORAL | Status: DC | PRN
  Administered 2019-11-27 – 2019-11-28 (×2): 2 mg via ORAL
  Filled 2019-11-26 (×3): qty 1

## 2019-11-26 NOTE — Progress Notes (Addendum)
TOC CM contacted [facility], left message for [name], Admission Coordinator for a return call about SNF placement. Isidoro Donning RN CCM, [facility] ED TOC CM [phone]  330 pm Spoke to Marcelina Morel [place] and she will begin the [place] process. She will accept pt back for SNF. Pt to dc back to [facility] pending auth approval. Isidoro Donning RN CCM, [facility] ED TOC CM [phone]

## 2019-11-26 NOTE — ED Provider Notes (Signed)
Emergency Medicine Observation Re-evaluation Note  Chelsea Bolton is a 59 y.o. female, seen on rounds today.  Pt initially presented to the ED for complaints of Fall and Seeking rehab placement Currently, the patient is awaiting SNF placement  Physical Exam  BP (!) 143/69 (BP Location: Right Arm)   Pulse 74   Temp (!) 97.5 F (36.4 C) (Oral)   Resp 16   Ht 5\' 6"  (1.676 m)   Wt (!) 158.8 kg   SpO2 97%   BMI 56.49 kg/m  Physical Exam Sleeping soundly ED Course / MDM  EKG:  Clinical Course as of Nov 25 729  Mon Nov 24, 2019  1659 Discussed with Dr. 1660, hospitalist. Suggest we board pt overnight with hopes of placement tomorrow.   [JR]    Clinical Course User Index [JR] Robinson, Adela Glimpse N, PA-C   I have reviewed the labs performed to date as well as medications administered while in observation.  Recent changes in the last 24 hours include none Plan  Current plan is for SNF placement Patient is not under full IVC at this time.   [place], MD 11/26/19 [phone]

## 2019-11-27 LAB — CBG MONITORING, ED
Glucose-Capillary: 100 mg/dL — ABNORMAL HIGH (ref 70–99)
Glucose-Capillary: 103 mg/dL — ABNORMAL HIGH (ref 70–99)
Glucose-Capillary: 91 mg/dL (ref 70–99)
Glucose-Capillary: 91 mg/dL (ref 70–99)
Glucose-Capillary: 96 mg/dL (ref 70–99)

## 2019-11-27 NOTE — ED Notes (Signed)
Pt arrived calm and cooperative.  She has a Comptroller at bedside.  Meal tray was given.

## 2019-11-27 NOTE — Progress Notes (Signed)
TOC CM contacted [name], Admission Coordinator with [facility] SN[facility]. Pt's Medicaid Chelsea Bolton is pending. Isidoro Donning RN CCM, [facility] ED TOC CM [phone]

## 2019-11-27 NOTE — ED Notes (Signed)
Previous note charted in error.  Wrong patient.

## 2019-11-28 LAB — CBG MONITORING, ED
Glucose-Capillary: 86 mg/dL (ref 70–99)
Glucose-Capillary: 90 mg/dL (ref 70–99)
Glucose-Capillary: 90 mg/dL (ref 70–99)

## 2019-11-28 NOTE — Social Work (Signed)
TOC CSW spoke with [name], Admission Coordinator with [facility].  [name] was in the process of following up with Medicaid for authorization.    CSW will continue to follow for dc needs.  Dempsey Knotek Tarpley-Carter, MSW, LCSW-A [facility] ED Transitions of Education administrator Health [phone]

## 2019-11-28 NOTE — ED Notes (Signed)
Pt cannot turn in bed r/t obesity and weakness; requires two person to do ADLs.

## 2019-11-28 NOTE — ED Notes (Signed)
Pt changed and turned.

## 2019-11-28 NOTE — ED Notes (Signed)
Refused dressing change. "It was done late yesterday and will not need changing until tomorrow."

## 2019-11-28 NOTE — ED Notes (Signed)
Refused Metformin because it makes her feel bad.

## 2019-11-28 NOTE — ED Notes (Signed)
Pt given bath, changed linen and gown. Dressings intact on lower extremities.

## 2019-11-29 LAB — CBG MONITORING, ED: Glucose-Capillary: 90 mg/dL (ref 70–99)

## 2019-11-29 NOTE — Progress Notes (Signed)
CSW sent AVS to facility via HIPPA-compliant [employer] email at request of [name] in admissions to facilitate admission to [facility].  RN and [name] conferring now on details.  CSW will continue to follow for D/C needs.  Chelsea Bolton. Adrion Menz  MSW, LCSW, LCAS, CCS Transitions of Care Clinical Social Worker Care Coordination Department Ph: [phone]

## 2019-11-29 NOTE — ED Notes (Signed)
Patient was discharged safely with [facility].  No RX was given due to the fact that one was recently filled.  Patient stated that she would be fine without oxycodone.

## 2019-11-29 NOTE — Progress Notes (Signed)
CSW was able to get in touch with [name] in admissions from [facility] and provide her with the reference # from [facility] for pt's ins auth.   CSW awaiting return call from [place] with pt's room # and number for report.  CSW will continue to follow for D/C needs.  Dorothe Pea. Jaren Vanetten  MSW, LCSW, LCAS, CCS Transitions of Care Clinical Social Worker Care Coordination Department Ph: [phone]

## 2019-11-29 NOTE — Progress Notes (Signed)
CSW received a call from pt's RN stating per Chelsea Bolton, pt's ins Berkley Harvey was approved.  CSW called Chelsea Bolton at [facility] to inform her pt's Berkley Harvey had been approved while pt's RN callled for the auth # from [place].  CSW called and left a HIPPA-Compliant VM with Chelsea Bolton in admissions at [facility] to let her know of the above and that when report was called staff was not expecting not have they accepted the report from pt's RN yet.  CSW will continue to follow for D/C needs.  Chelsea Bolton  MSW, LCSW, LCAS, CCS Transitions of Care Clinical Social Worker Care Coordination Department Ph: [phone]

## 2019-11-29 NOTE — Progress Notes (Signed)
CSW received a call from [name] at [facility] stating the patient has been offered a bed and has been accepted and that the pt can arrive on 11/29/19.  The pt's accepting doctor is the SNF MD  The room number will be TBD on [facility].  The number for report is [phone] for [facility] Nurse.  CSW will update RN and EDP.  Dorothe Pea. Briah Nary, Theresia Majors, LCAS Clinical Social Worker Ph: [phone]

## 2019-11-29 NOTE — ED Notes (Signed)
Jackson Latino with [employer] called and said that the authorization was approved to return to [facility].  The reference ID is [id number].

## 2019-11-29 NOTE — ED Provider Notes (Signed)
Transfer to skilled nursing facility.  Has finished her antibiotics.  Stable throughout her care.   Virgina Norfolk, DO 11/29/19 1456

## 2019-11-29 NOTE — Progress Notes (Signed)
CSW called Jackson Latino at [facility] who confirmed pt's Berkley Harvey was approved and the ref # and Berkley Harvey # are one and the same: [id number] and that # will demonstrate an approved auth.  [name] in admissions at [facility] updated and says pt can go to [facility] now.  Pt's RN/EDP updated and EDP updated the AVS per [facility] request.  RN calling [facility] when ready, per RN.  CSW will continue to follow for D/C needs.  Dorothe Pea. Avalynn Bowe  MSW, LCSW, LCAS, CCS Transitions of Care Clinical Social Worker Care Coordination Department Ph: [phone]

## 2019-12-05 ENCOUNTER — Encounter (HOSPITAL_BASED_OUTPATIENT_CLINIC_OR_DEPARTMENT_OTHER): Payer: Medicaid Other | Attending: Internal Medicine | Admitting: Internal Medicine

## 2020-01-12 MED FILL — Gabapentin Cap 300 MG: ORAL | Qty: 900 | Status: AC

## 2020-01-12 MED FILL — Ondansetron Orally Disintegrating Tab 4 MG: ORAL | Qty: 4 | Status: AC

## 2020-01-12 MED FILL — Carvedilol Tab 12.5 MG: ORAL | Qty: 12.5 | Status: AC

## 2020-01-12 MED FILL — Bupropion HCl Tab ER 24HR 150 MG: ORAL | Qty: 300 | Status: AC

## 2020-01-12 MED FILL — Sulfamethoxazole-Trimethoprim Tab 800-160 MG: ORAL | Qty: 1 | Status: AC

## 2020-01-12 MED FILL — Ciprofloxacin HCl Tab 500 MG (Base Equiv): ORAL | Qty: 500 | Status: AC

## 2020-01-12 MED FILL — Losartan Potassium Tab 50 MG: ORAL | Qty: 100 | Status: AC

## 2020-01-12 MED FILL — Oxycodone HCl Tab 5 MG: ORAL | Qty: 5 | Status: AC

## 2020-01-12 MED FILL — Pantoprazole Sodium EC Tab 40 MG (Base Equiv): ORAL | Qty: 40 | Status: AC

## 2020-01-12 MED FILL — Metformin HCl Tab 500 MG: ORAL | Qty: 1000 | Status: AC

## 2021-08-08 IMAGING — DX DG ANKLE COMPLETE 3+V*L*
3 series · 3 of 3 positions shown · non-contrast
Comparison: None.

CLINICAL DATA: New soft tissue wound.  Concern for osteomyelitis.

EXAM:
LEFT ANKLE COMPLETE - 3+ VIEW

[ankle ap]
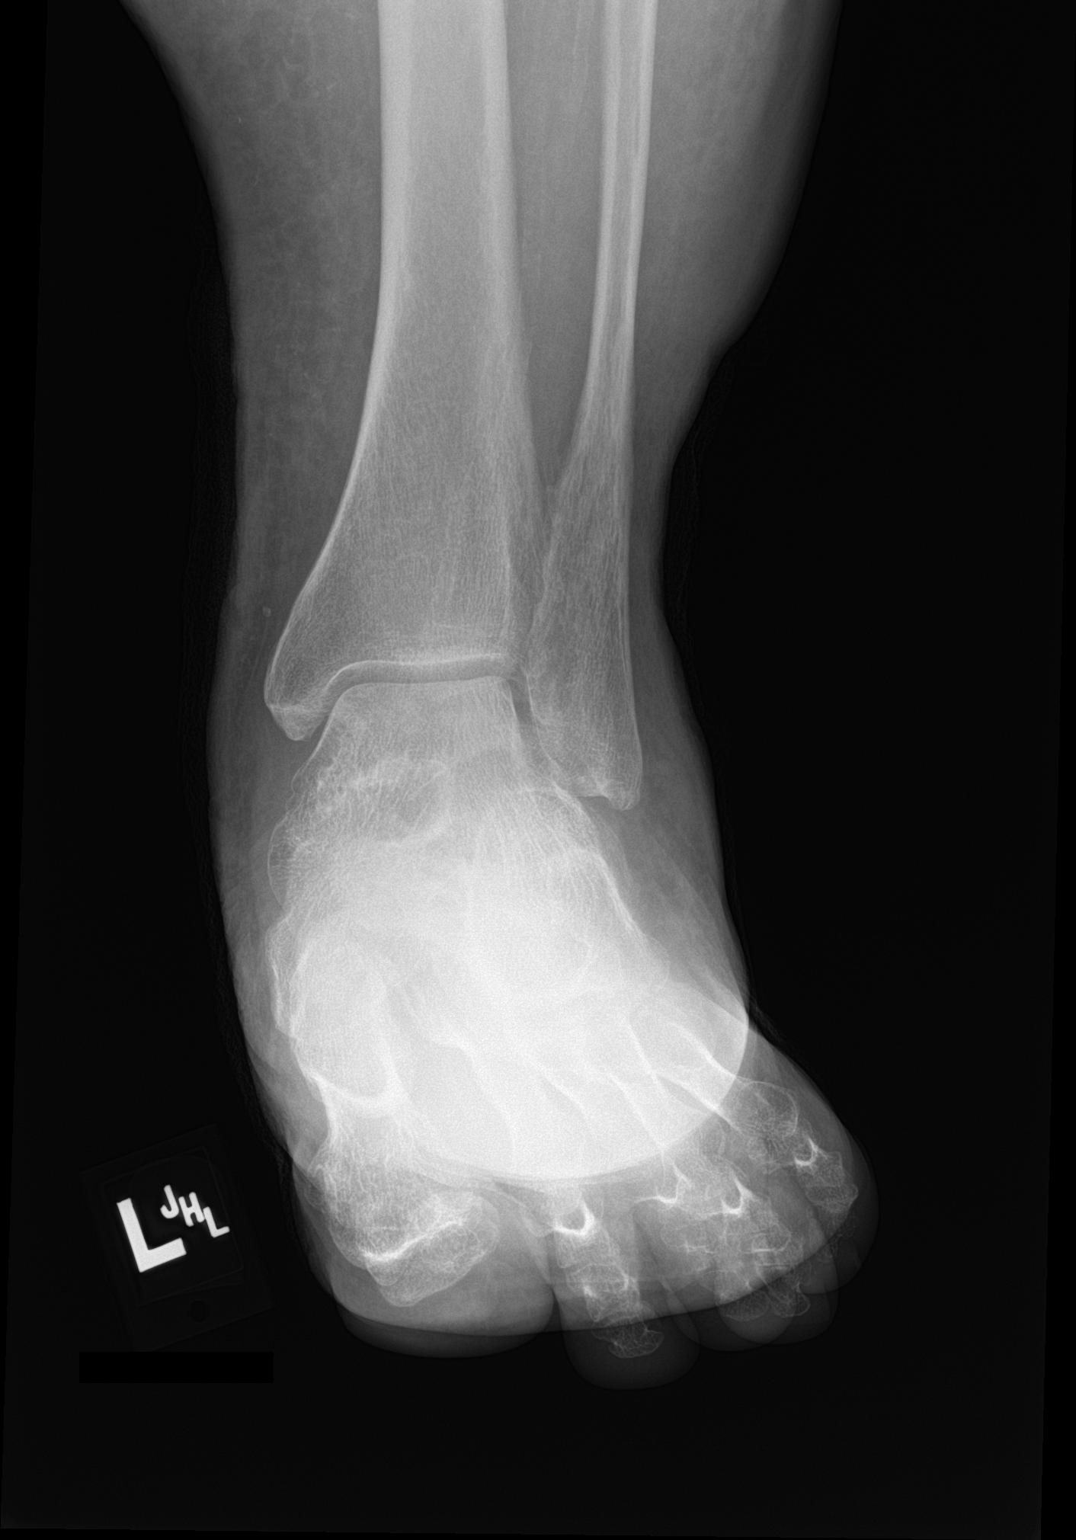

[ankle obl]
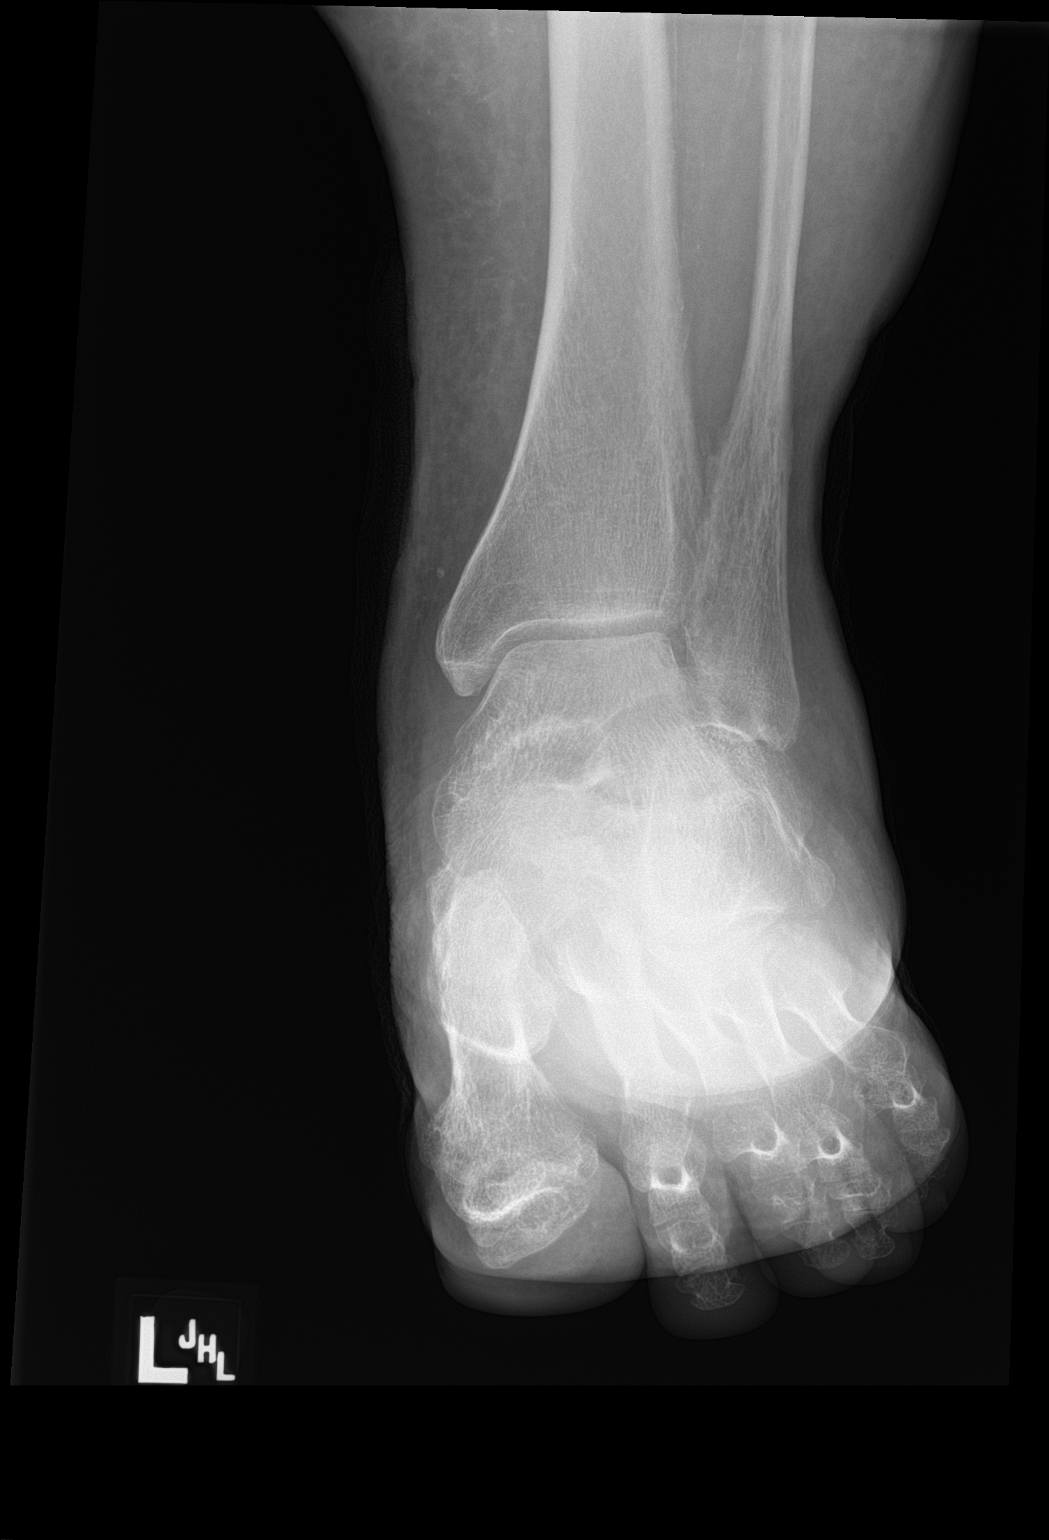

[ankle lat]
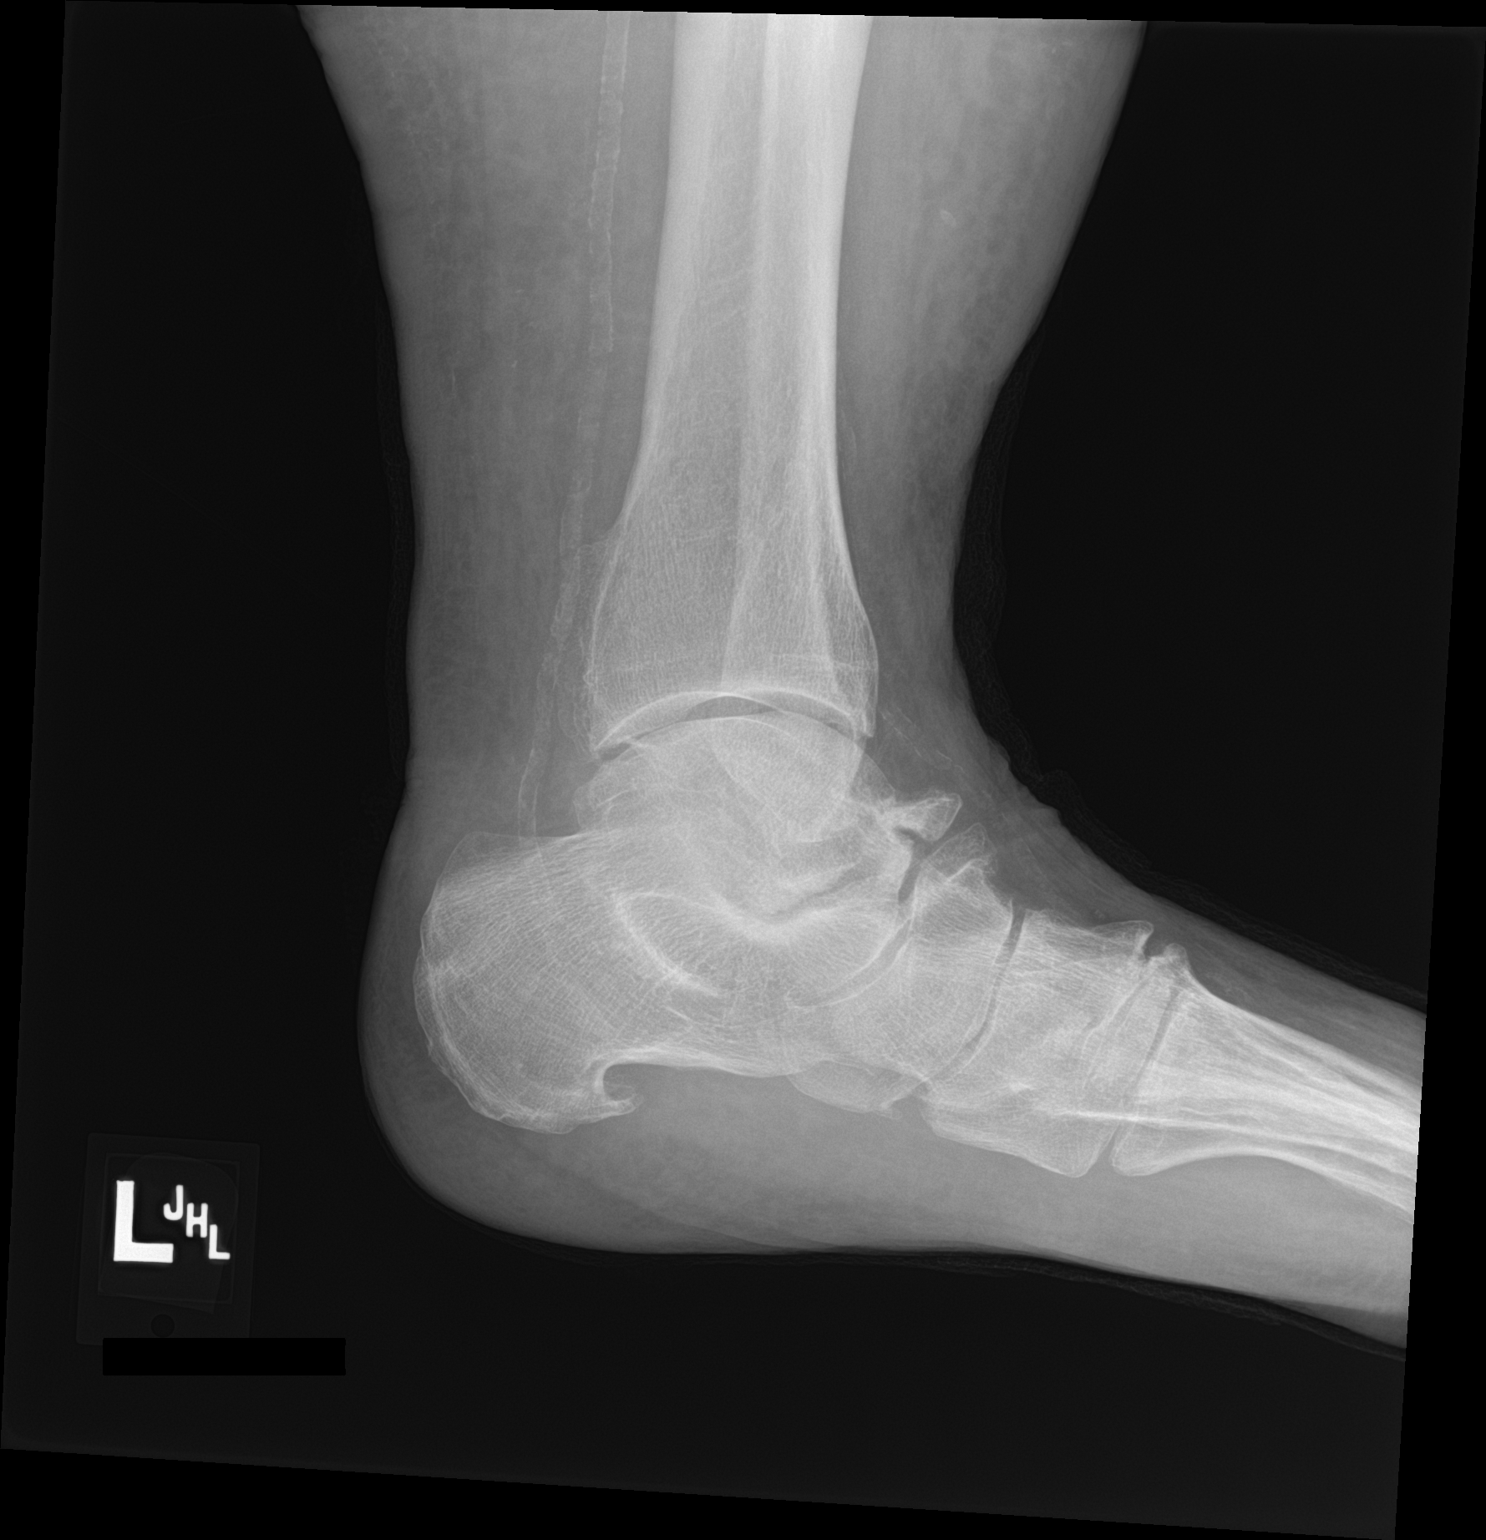

[3 of 3 positions shown; findings below may reference images not displayed]

FINDINGS: There is no evidence of fracture, dislocation, or joint effusion. A
plantar calcaneal enthesophyte is noted. Degenerative changes are
seen involving the talonavicular joint and in the dorsal midfoot.
There is soft tissue swelling around the ankle. No focal osseous
demineralization is identified to suggest osteomyelitis.
IMPRESSION: 1. No radiographic evidence of osteomyelitis.

## 2021-08-08 IMAGING — DX DG CHEST 1V PORT
1 series · 1 of 1 positions shown · non-contrast
Comparison: 10/15/2019

CLINICAL DATA: Sepsis

EXAM:
PORTABLE CHEST 1 VIEW

[chest ap]
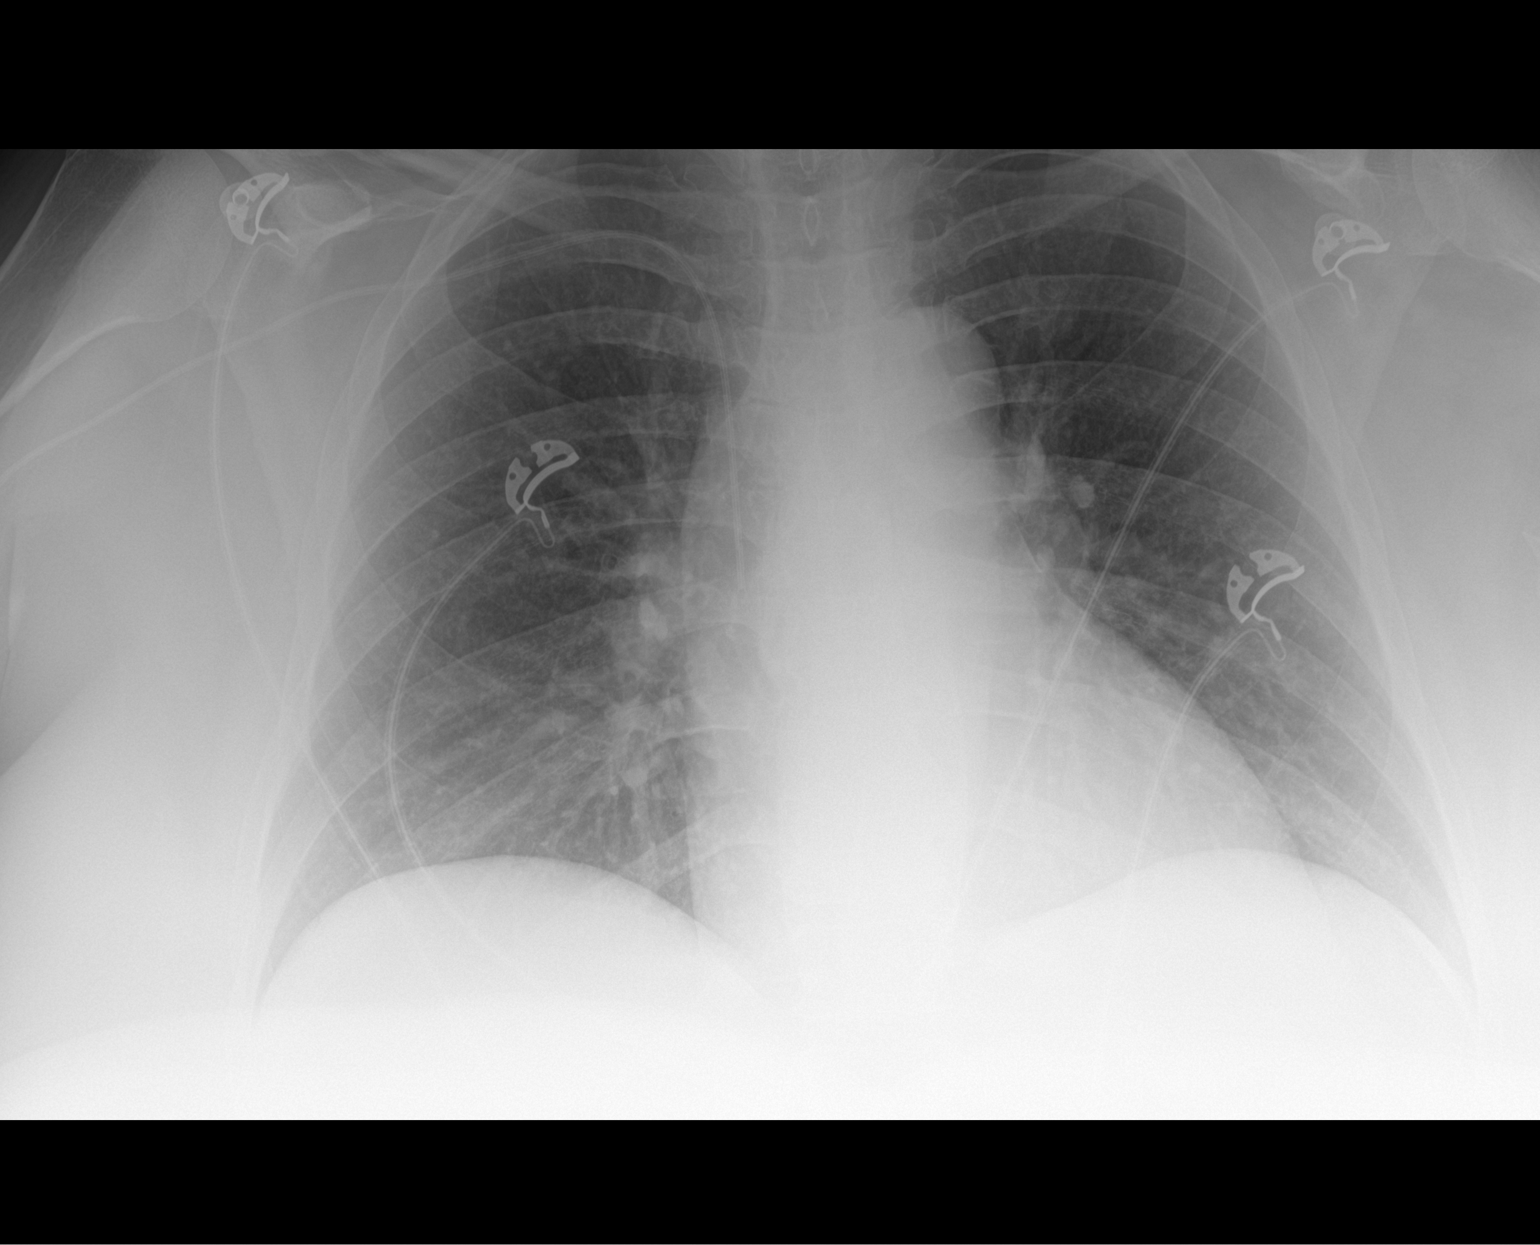

[1 of 1 positions shown; findings below may reference images not displayed]

FINDINGS: RIGHT-sided PICC line terminates in the mid to distal superior vena
cava.

Cardiomediastinal contours and hilar structures are normal.

Trachea is midline.

Lungs are clear.  No sign of pleural effusion.

Visualized skeletal structures on limited assessment without acute
process.
IMPRESSION: No acute cardiopulmonary disease.

RIGHT-sided PICC line in place as described.
